# Patient Record
Sex: Male | Born: 1960 | Race: White | Hispanic: No | Marital: Married | Smoking: Current every day smoker
Health system: Southern US, Community
[De-identification: ages and names within clinical notes are randomized; demographics above are authoritative.]

## PROBLEM LIST (undated history)

## (undated) DIAGNOSIS — K759 Inflammatory liver disease, unspecified: Secondary | ICD-10-CM

## (undated) DIAGNOSIS — I10 Essential (primary) hypertension: Secondary | ICD-10-CM

---

## 2015-07-24 DIAGNOSIS — K4090 Unilateral inguinal hernia, without obstruction or gangrene, not specified as recurrent: Secondary | ICD-10-CM | POA: Insufficient documentation

## 2016-02-19 DIAGNOSIS — R945 Abnormal results of liver function studies: Secondary | ICD-10-CM

## 2016-02-19 DIAGNOSIS — R030 Elevated blood-pressure reading, without diagnosis of hypertension: Secondary | ICD-10-CM | POA: Insufficient documentation

## 2016-02-19 DIAGNOSIS — R7989 Other specified abnormal findings of blood chemistry: Secondary | ICD-10-CM | POA: Insufficient documentation

## 2017-04-30 ENCOUNTER — Emergency Department: Payer: Managed Care, Other (non HMO)

## 2017-04-30 ENCOUNTER — Encounter: Payer: Self-pay | Admitting: Emergency Medicine

## 2017-04-30 ENCOUNTER — Inpatient Hospital Stay
Admission: EM | Admit: 2017-04-30 | Discharge: 2017-05-08 | DRG: 439 | Disposition: A | Discharge status: Home / Self Care | Payer: Managed Care, Other (non HMO) | Attending: Internal Medicine | Admitting: Internal Medicine

## 2017-04-30 DIAGNOSIS — R112 Nausea with vomiting, unspecified: Secondary | ICD-10-CM | POA: Diagnosis present

## 2017-04-30 DIAGNOSIS — K852 Alcohol induced acute pancreatitis without necrosis or infection: Secondary | ICD-10-CM | POA: Diagnosis present

## 2017-04-30 DIAGNOSIS — F17210 Nicotine dependence, cigarettes, uncomplicated: Secondary | ICD-10-CM | POA: Diagnosis present

## 2017-04-30 DIAGNOSIS — I10 Essential (primary) hypertension: Secondary | ICD-10-CM | POA: Diagnosis present

## 2017-04-30 DIAGNOSIS — K5900 Constipation, unspecified: Secondary | ICD-10-CM | POA: Diagnosis present

## 2017-04-30 DIAGNOSIS — K760 Fatty (change of) liver, not elsewhere classified: Secondary | ICD-10-CM | POA: Diagnosis present

## 2017-04-30 DIAGNOSIS — E876 Hypokalemia: Secondary | ICD-10-CM | POA: Diagnosis present

## 2017-04-30 DIAGNOSIS — K2920 Alcoholic gastritis without bleeding: Secondary | ICD-10-CM | POA: Diagnosis present

## 2017-04-30 DIAGNOSIS — K859 Acute pancreatitis without necrosis or infection, unspecified: Secondary | ICD-10-CM | POA: Diagnosis present

## 2017-04-30 DIAGNOSIS — N2889 Other specified disorders of kidney and ureter: Secondary | ICD-10-CM

## 2017-04-30 DIAGNOSIS — K7010 Alcoholic hepatitis without ascites: Secondary | ICD-10-CM | POA: Diagnosis present

## 2017-04-30 DIAGNOSIS — E871 Hypo-osmolality and hyponatremia: Secondary | ICD-10-CM | POA: Diagnosis present

## 2017-04-30 DIAGNOSIS — R1010 Upper abdominal pain, unspecified: Secondary | ICD-10-CM

## 2017-04-30 DIAGNOSIS — K2980 Duodenitis without bleeding: Secondary | ICD-10-CM | POA: Diagnosis present

## 2017-04-30 DIAGNOSIS — F1010 Alcohol abuse, uncomplicated: Secondary | ICD-10-CM | POA: Diagnosis present

## 2017-04-30 HISTORY — DX: Essential (primary) hypertension: I10

## 2017-04-30 HISTORY — DX: Inflammatory liver disease, unspecified: K75.9

## 2017-04-30 LAB — TROPONIN I: Troponin I: <0.03 ng/mL (ref <0.03)

## 2017-04-30 LAB — BASIC METABOLIC PANEL
Anion gap: 11 (ref 5–15)
BUN: 10 mg/dL (ref 6–20)
CALCIUM: 8 mg/dL — AB (ref 8.9–10.3)
CO2: 19 mmol/L — ABNORMAL LOW (ref 22–32)
CREATININE: 0.76 mg/dL (ref 0.61–1.24)
Chloride: 95 mmol/L — ABNORMAL LOW (ref 101–111)
Glucose, Bld: 98 mg/dL (ref 65–99)
Potassium: 3.4 mmol/L — ABNORMAL LOW (ref 3.5–5.1)
SODIUM: 125 mmol/L — AB (ref 135–145)

## 2017-04-30 LAB — HEPATIC FUNCTION PANEL
ALK PHOS: 101 U/L (ref 38–126)
ALT: 130 U/L — ABNORMAL HIGH (ref 17–63)
AST: 245 U/L — ABNORMAL HIGH (ref 15–41)
Albumin: 3.7 g/dL (ref 3.5–5.0)
Bilirubin, Direct: <0.1 mg/dL — ABNORMAL LOW (ref 0.1–0.5)
TOTAL PROTEIN: 5.7 g/dL — AB (ref 6.5–8.1)
Total Bilirubin: 0.9 mg/dL (ref 0.3–1.2)

## 2017-04-30 LAB — CBC
HEMATOCRIT: 49.5 % (ref 40.0–52.0)
Hemoglobin: 18.8 g/dL — ABNORMAL HIGH (ref 13.0–18.0)
MCV: 97.6 fL (ref 80.0–100.0)
Platelets: 172 10*3/uL (ref 150–440)
RBC: 5.07 MIL/uL (ref 4.40–5.90)
RDW: 12.8 % (ref 11.5–14.5)
WBC: 15.2 10*3/uL — AB (ref 3.8–10.6)

## 2017-04-30 LAB — PROTIME-INR
INR: 0.96
Prothrombin Time: 12.8 seconds (ref 11.4–15.2)

## 2017-04-30 LAB — LIPASE, BLOOD: LIPASE: 254 U/L — AB (ref 11–51)

## 2017-04-30 MED ORDER — KETOROLAC TROMETHAMINE 30 MG/ML IJ SOLN
15.0000 mg | INTRAMUSCULAR | Status: AC
Start: 2017-04-30 — End: 2017-04-30
  Administered 2017-04-30: 15 mg via INTRAVENOUS
  Filled 2017-04-30: qty 1

## 2017-04-30 MED ORDER — POTASSIUM CHLORIDE CRYS ER 20 MEQ PO TBCR
20.0000 meq | EXTENDED_RELEASE_TABLET | Freq: Once | ORAL | Status: AC
  Administered 2017-04-30: 20 meq via ORAL
  Filled 2017-04-30: qty 1

## 2017-04-30 MED ORDER — THIAMINE HCL 100 MG/ML IJ SOLN: 100.0000 mg | Freq: Every day | INTRAMUSCULAR | Status: DC

## 2017-04-30 MED ORDER — MORPHINE SULFATE (PF) 2 MG/ML IV SOLN
1.0000 mg | INTRAVENOUS | Status: DC | PRN
  Administered 2017-05-01 – 2017-05-06 (×10): 1 mg via INTRAVENOUS
  Filled 2017-04-30 (×10): qty 1

## 2017-04-30 MED ORDER — PANTOPRAZOLE SODIUM 40 MG IV SOLR
40.0000 mg | Freq: Once | INTRAVENOUS | Status: AC
  Administered 2017-04-30: 40 mg via INTRAVENOUS
  Filled 2017-04-30: qty 40

## 2017-04-30 MED ORDER — ADULT MULTIVITAMIN W/MINERALS CH
1.0000 | ORAL_TABLET | Freq: Every day | ORAL | Status: DC
  Administered 2017-05-03 – 2017-05-08 (×6): 1 via ORAL
  Filled 2017-04-30 (×6): qty 1

## 2017-04-30 MED ORDER — SODIUM CHLORIDE 0.9 % IV SOLN
INTRAVENOUS | Status: DC
  Administered 2017-04-30 – 2017-05-03 (×5): via INTRAVENOUS

## 2017-04-30 MED ORDER — LORAZEPAM 1 MG PO TABS: 1.0000 mg | ORAL_TABLET | Freq: Four times a day (QID) | ORAL | Status: AC | PRN

## 2017-04-30 MED ORDER — MORPHINE SULFATE (PF) 4 MG/ML IV SOLN
4.0000 mg | Freq: Once | INTRAVENOUS | Status: AC
  Administered 2017-04-30: 4 mg via INTRAVENOUS
  Filled 2017-04-30: qty 1

## 2017-04-30 MED ORDER — ONDANSETRON HCL 4 MG PO TABS: 4.0000 mg | ORAL_TABLET | Freq: Four times a day (QID) | ORAL | Status: DC | PRN

## 2017-04-30 MED ORDER — SODIUM CHLORIDE 0.9 % IV BOLUS (SEPSIS)
1000.0000 mL | Freq: Once | INTRAVENOUS | Status: AC
  Administered 2017-04-30: 1000 mL via INTRAVENOUS

## 2017-04-30 MED ORDER — ONDANSETRON HCL 4 MG/2ML IJ SOLN
4.0000 mg | Freq: Four times a day (QID) | INTRAMUSCULAR | Status: DC | PRN
  Administered 2017-05-03: 4 mg via INTRAVENOUS
  Filled 2017-04-30: qty 2

## 2017-04-30 MED ORDER — PANTOPRAZOLE SODIUM 40 MG IV SOLR
40.0000 mg | Freq: Two times a day (BID) | INTRAVENOUS | Status: DC
Start: 2017-04-30 — End: 2017-05-07
  Administered 2017-04-30 – 2017-05-07 (×14): 40 mg via INTRAVENOUS
  Filled 2017-04-30 (×14): qty 40

## 2017-04-30 MED ORDER — VITAMIN B-1 100 MG PO TABS
100.0000 mg | ORAL_TABLET | Freq: Every day | ORAL | Status: DC
  Administered 2017-05-03 – 2017-05-08 (×6): 100 mg via ORAL
  Filled 2017-04-30 (×6): qty 1

## 2017-04-30 MED ORDER — IOPAMIDOL (ISOVUE-300) INJECTION 61%
100.0000 mL | Freq: Once | INTRAVENOUS | Status: AC | PRN
  Administered 2017-04-30: 100 mL via INTRAVENOUS

## 2017-04-30 MED ORDER — ONDANSETRON HCL 4 MG/2ML IJ SOLN
4.0000 mg | Freq: Once | INTRAMUSCULAR | Status: AC
  Administered 2017-04-30: 4 mg via INTRAVENOUS
  Filled 2017-04-30: qty 2

## 2017-04-30 MED ORDER — MORPHINE BOLUS VIA INFUSION: 4.0000 mg | Freq: Once | INTRAVENOUS | Status: DC

## 2017-04-30 MED ORDER — FOLIC ACID 1 MG PO TABS
1.0000 mg | ORAL_TABLET | Freq: Every day | ORAL | Status: DC
  Administered 2017-05-03 – 2017-05-08 (×6): 1 mg via ORAL
  Filled 2017-04-30 (×6): qty 1

## 2017-04-30 MED ORDER — NICOTINE 21 MG/24HR TD PT24
21.0000 mg | MEDICATED_PATCH | Freq: Every day | TRANSDERMAL | Status: DC
  Administered 2017-05-01 – 2017-05-08 (×8): 21 mg via TRANSDERMAL
  Filled 2017-04-30 (×8): qty 1

## 2017-04-30 MED ORDER — LORAZEPAM 2 MG/ML IJ SOLN: 1.0000 mg | Freq: Four times a day (QID) | INTRAMUSCULAR | Status: AC | PRN

## 2017-04-30 NOTE — ED Notes (Signed)
Floor refused to take report for pt stating that BP was to high (per their charge) - ED Charge Lea RN notified

## 2017-04-30 NOTE — ED Notes (Signed)
Attempted IV x2 - once in right hand and once in left [facility] without success

## 2017-04-30 NOTE — ED Notes (Signed)
Patient transported to CT 

## 2017-04-30 NOTE — ED Provider Notes (Addendum)
[facility] Emergency Department Provider Note  ____________________________________________  Time seen: Approximately 6:35 PM  I have reviewed the triage vital signs and the nursing notes.   HISTORY  Chief Complaint Chest Pain and Abdominal Pain    HPI Nathan Herrera is a 56 y.o. male who complains of diffuse upper abdominal pain with nausea and vomiting since yesterday. Nonradiating, no aggravating or alleviating factors. Severe, sharp, constant.  Reports that he drinks 4 beers a day, denies withdrawal symptoms when he doesn't drink. Never had anything like this before. Normally only eats about 1 meal a day at dinner.  Wife reports actually the patient usually drinks 12-24 beers a day.      History reviewed. No pertinent past medical history.   There are no active problems to display for this patient.    No past surgical history on file. Negative  Prior to Admission medications   Not on File  None   Allergies Patient has no known allergies.   No family history on file.  Social History Social History  Substance Use Topics  . Smoking status: Current Every Day Smoker    Packs/day: 0.50  . Smokeless tobacco: Never Used  . Alcohol use No     Comment: 3-4 beers daily    Review of Systems  Constitutional:   No fever or chills.  ENT:   No sore throat. No rhinorrhea. Cardiovascular:   No chest pain or syncope. Respiratory:   No dyspnea or cough. Gastrointestinal:   Positive as above for abdominal pain with vomiting. No constipation or diarrhea.  Musculoskeletal:   Negative for focal pain or swelling All other systems reviewed and are negative except as documented above in ROS and HPI.  ____________________________________________   PHYSICAL EXAM:  VITAL SIGNS: ED Triage Vitals  Enc Vitals Group     BP 04/30/17 1316 (!) 156/98     Pulse Rate 04/30/17 1316 73     Resp 04/30/17 1316 16     Temp 04/30/17 1316 97.6 F (36.4 C)      Temp src --      SpO2 04/30/17 1316 97 %     Weight 04/30/17 1317 170 lb (77.1 kg)     Height 04/30/17 1317 5\' 7"  (1.702 m)     Head Circumference --      Peak Flow --      Pain Score 04/30/17 1316 9     Pain Loc --      Pain Edu? --      Excl. in GC? --     Vital signs reviewed, nursing assessments reviewed.   Constitutional:   Alert and oriented. Well appearing and in no distress. Eyes:   No scleral icterus.  EOMI. No nystagmus. No conjunctival pallor. PERRL. ENT   Head:   Normocephalic and atraumatic.   Nose:   No congestion/rhinnorhea.    Mouth/Throat:   Dry mucous membranes, no pharyngeal erythema. No peritonsillar mass.    Neck:   No meningismus. Full ROM Hematological/Lymphatic/Immunilogical:   No cervical lymphadenopathy. Cardiovascular:   RRR. Symmetric bilateral radial and DP pulses.  No murmurs.  Respiratory:   Normal respiratory effort without tachypnea/retractions. Breath sounds are clear and equal bilaterally. No wheezes/rales/rhonchi. Gastrointestinal:   Soft with diffuse upper abdominal tenderness.. Non distended. There is no CVA tenderness.  No rebound, rigidity, or guarding. Genitourinary:   deferred Musculoskeletal:   Normal range of motion in all extremities. No joint effusions.  No lower extremity tenderness.  No  edema. Neurologic:   Normal speech and language.  Motor grossly intact. No gross focal neurologic deficits are appreciated.  Skin:    Skin is warm, dry and intact. No rash noted.  No petechiae, purpura, or bullae.  ____________________________________________    LABS (pertinent positives/negatives) (all labs ordered are listed, but only abnormal results are displayed) Labs Reviewed  BASIC METABOLIC PANEL - Abnormal; Notable for the following:       Result Value   Sodium 125 (*)    Potassium 3.4 (*)    Chloride 95 (*)    CO2 19 (*)    Calcium 8.0 (*)    All other components within normal limits  CBC - Abnormal; Notable for the  following:    WBC 15.2 (*)    Hemoglobin 18.8 (*)    All other components within normal limits  HEPATIC FUNCTION PANEL - Abnormal; Notable for the following:    Total Protein 5.7 (*)    AST 245 (*)    ALT 130 (*)    Bilirubin, Direct <0.1 (*)    All other components within normal limits  LIPASE, BLOOD - Abnormal; Notable for the following:    Lipase 254 (*)    All other components within normal limits  TROPONIN I   ____________________________________________   EKG  Interpreted by me Sinus rhythm rate of 79, normal axis and intervals. Normal QRS ST segments and T waves.  ____________________________________________    RADIOLOGY  Dg Chest 2 View  Result Date: 04/30/2017 CLINICAL DATA:  Chest pain and chronic cough EXAM: CHEST  2 VIEW COMPARISON:  None. FINDINGS: Lungs are clear. Heart size and pulmonary vascularity are normal. No adenopathy. There is a focal hiatal hernia. No pneumothorax. No bone lesions. IMPRESSION: Hiatal hernia present.  No edema or consolidation. Electronically Signed   By: Bretta Bang III M.D.   On: 04/30/2017 14:05   Ct Abdomen Pelvis W Contrast  Result Date: 04/30/2017 CLINICAL DATA:  Abdominal pain with nausea and vomiting EXAM: CT ABDOMEN AND PELVIS WITH CONTRAST TECHNIQUE: Multidetector CT imaging of the abdomen and pelvis was performed using the standard protocol following bolus administration of intravenous contrast. CONTRAST:  ISOVUE-300 IOPAMIDOL (ISOVUE-300) INJECTION 61% COMPARISON:  None. FINDINGS: Lower chest: There is slight bibasilar lung atelectatic change. No lung base edema or consolidation. There is a moderate hiatal hernia, incompletely visualized. Hepatobiliary: Liver measures 20.0 cm in length. There is diffuse hepatic steatosis. No focal liver lesions are evident. Gallbladder wall is not appreciably thickened. There is no biliary duct dilatation. Pancreas: The pancreatic head and uncinate process of mildly edematous. There is  fluid surrounding the head and uncinate process of the pancreas as well as extending posterior to the body of the pancreas consistent with pancreatitis. No pancreatic mass or duct dilatation is evident. The pancreatic tail appears normal. Fluid tracks from the pancreas to surround the celiac axis. Fluid extends to the level of the inferior vena cava anteriorly at the level of the insertion site of the renal veins. Fluid tracks along the third portion of the duodenum with irregular wall thickening along the third and distal second portions of the duodenum with apparent inflammation in this area. Fluid tracks more inferiorly in the midline and slightly to the right of midline to the level of the aortic bifurcation. Fluid abuts the anterior right perinephric fascia with slight anterior perinephric stranding on the right. Fluid does not extend into the pelvis from the pancreas. More anteriorly, fluid extends to abut the  first and proximal second portions of the duodenum. Peripancreatic fluid does not abut the stomach. Spleen: There is a 1.2 x 1.0 cm mass in the anterior spleen, statistically most likely a hemangioma. No other splenic lesions are evident. Adrenals/Urinary Tract: Adrenals appear normal bilaterally. There is a mass arising from the posterior mid left kidney measuring 0.9 x 0.9 cm which has attenuation values higher than is expected with a cyst. There is a simple cyst in the anterior lower pole right kidney measuring 1.6 x 1.5 cm. There is a nearby 8 x 8 mm cyst in the lower pole right kidney anteriorly. There is a 7 x 7 mm cyst in the posterior upper pole right kidney as well. There is no hydronephrosis on either side. There is no renal or ureteral calculus on either side. Urinary bladder is midline with wall thickness within normal limits. Stomach/Bowel: Irregular wall thickening is noted in the third portion of the duodenum with the surrounding peripancreatic fluid as described above. Elsewhere, there is  no appreciable bowel wall thickening. No bowel obstruction evident. No free air or portal venous air. Vascular/Lymphatic: There is no abdominal aortic aneurysm. There is slight atherosclerotic calcification in the aorta. Major mesenteric vessels appear patent. There is no adenopathy evident in the abdomen or pelvis. Reproductive: There are prostatic calculi in the leftward aspect of the prostate. Prostate and seminal vesicles appear normal in size and contour. No pelvic mass evident. There are several small calcifications in the right spermatic cord in the inguinal region, benign in appearance. Other: Appendix appears normal. There is no free fluid in the abdomen or pelvis. No abscess evident in the abdomen or pelvis. Musculoskeletal: There is degenerative change in the lumbar spine. There is an apparent hemangioma in the rightward aspect of the T10 vertebral body. There is no intramuscular or abdominal wall lesion evident. There are no blastic or lytic bone lesions. IMPRESSION: 1. Acute pancreatitis with edema of the head and uncinate process of the pancreas. There is peripancreatic fluid surrounding the head, uncinate process, and portion of the body of the pancreas. No pancreatic mass or pseudocyst evident. No pancreatic duct dilatation. 2. Peripancreatic fluid extends along the duodenum with extensive wall irregularity in the third portion of the duodenum. There may well be a degree of ulceration in this area. To a lesser degree, there is wall thickening in the second portion of the duodenum. Note that the degree of third portion duodenum inflammation may warrant nonemergent direct visualization. 3. There is a 0.9 x 0.9 cm mass arising from the posterior mid left kidney which has attenuation values higher than is expected with a simple cyst. As a small renal neoplasm could present in this manner, further assessment is advised. Nonemergent further evaluation with pre and post contrast MRI should be considered.  Pre and post contrast CT could alternatively be performed, but would likely be of decreased accuracy given lesion size. 4.  Aortic atherosclerosis. 5. No bowel obstruction. No frank abscess in the abdomen or pelvis. Appendix appears normal. 6. There are prostatic calculi. No renal or ureteral calculus on either side. No hydronephrosis. 7.  Moderate hiatal hernia, incompletely visualized. 8.  Enlarged liver with hepatic steatosis. Electronically Signed   By: Bretta Bang III M.D.   On: 04/30/2017 17:12    ____________________________________________   PROCEDURES Procedures  ____________________________________________   INITIAL IMPRESSION / ASSESSMENT AND PLAN / ED COURSE  Pertinent labs & imaging results that were available during my care of the patient were reviewed  by me and considered in my medical decision making (see chart for details).  Patient presents with abdominal pain nausea and vomiting. Labs and CT reveal acute pancreatitis, likely due to his history of drinking and poor oral intake otherwise. Continue IV fluids, IV analgesics and antiemetics. Case discussed with hospitalist for further management. No evidence of alcohol withdrawal at present time. Hemodynamically stable.      ____________________________________________   FINAL CLINICAL IMPRESSION(S) / ED DIAGNOSES  Final diagnoses:  Alcohol-induced acute pancreatitis, unspecified complication status  Pain of upper abdomen  Hyponatremia      New Prescriptions   No medications on file     Portions of this note were generated with dragon dictation software. Dictation errors may occur despite best attempts at proofreading.    Sharman Cheek, MD 04/30/17 Jaye Beagle    Sharman Cheek, MD 04/30/17 [phone]

## 2017-04-30 NOTE — ED Notes (Signed)
Pt reports that he is having chest pain and abd cramping that started yesterday afternoon - c/o N/V - denies diarrhea - denies shortness of breath at this time but reports that his left hand is numb at this time - denies any cardiac conditions or previous MI's

## 2017-04-30 NOTE — ED Triage Notes (Signed)
States chest pain, low abd pain, n/v since yesterday.

## 2017-04-30 NOTE — H&P (Signed)
Nathan Herrera is an 56 y.o. male.   Chief Complaint: abdominal pain HPI: this is a 56 year old male who has a history of drinking 6-12 beers a day presents with a 2 day history of epigastric pain associated with nausea and vomiting. He also takes BC powders for headaches 3 or 4 times a week. He denies any having any coffee-ground-looking emesis or dark stools. He was found to have pancreatitis and hospitalist services were consulted for admission.  History reviewed. No pertinent past medical history.  No past surgical history on file.history of left inguinal hernia repair  No family history on file. Social History:  reports that he has been smoking.  He has been smoking about 0.50 packs per day. He has never used smokeless tobacco. He reports that he does not drink alcohol or use drugs. drinks 6-12 beers per day  Allergies: No Known Allergies   (Not in a hospital admission)  Results for orders placed or performed during the hospital encounter of 04/30/17 (from the past 48 hour(s))  Basic metabolic panel     Status: Abnormal   Collection Time: 04/30/17  1:23 PM  Result Value Ref Range   Sodium 125 (L) 135 - 145 mmol/L   Potassium 3.4 (L) 3.5 - 5.1 mmol/L   Chloride 95 (L) 101 - 111 mmol/L   CO2 19 (L) 22 - 32 mmol/L   Glucose, Bld 98 65 - 99 mg/dL   BUN 10 6 - 20 mg/dL   Creatinine, Ser 0.76 0.61 - 1.24 mg/dL   Calcium 8.0 (L) 8.9 - 10.3 mg/dL   GFR calc non Af Amer >60 >60 mL/min   GFR calc Af Amer >60 >60 mL/min    Comment: (NOTE) The eGFR has been calculated using the CKD EPI equation. This calculation has not been validated in all clinical situations. eGFR's persistently <60 mL/min signify possible Chronic Kidney Disease.    Anion gap 11 5 - 15  CBC     Status: Abnormal   Collection Time: 04/30/17  1:23 PM  Result Value Ref Range   WBC 15.2 (H) 3.8 - 10.6 K/uL   RBC 5.07 4.40 - 5.90 MIL/uL   Hemoglobin 18.8 (H) 13.0 - 18.0 g/dL    Comment: CORRECTED FOR LIPEMIA RESULT  REPEATED AND VERIFIED    HCT 49.5 40.0 - 52.0 %   MCV 97.6 80.0 - 100.0 fL   MCH RESULTS UNAVAILABLE DUE TO INTERFERING SUBSTANCE 26.0 - 34.0 pg   MCHC RESULTS UNAVAILABLE DUE TO INTERFERING SUBSTANCE 32.0 - 36.0 g/dL   RDW 12.8 11.5 - 14.5 %   Platelets 172 150 - 440 K/uL  Troponin I     Status: None   Collection Time: 04/30/17  1:23 PM  Result Value Ref Range   Troponin I <0.03 <0.03 ng/mL  Hepatic function panel     Status: Abnormal   Collection Time: 04/30/17  1:23 PM  Result Value Ref Range   Total Protein 5.7 (L) 6.5 - 8.1 g/dL   Albumin 3.7 3.5 - 5.0 g/dL   AST 245 (H) 15 - 41 U/L    Comment: RESULT CONFIRMED BY MANUAL DILUTION SDR   ALT 130 (H) 17 - 63 U/L    Comment: RESULT CONFIRMED BY MANUAL DILUTION SDR   Alkaline Phosphatase 101 38 - 126 U/L   Total Bilirubin 0.9 0.3 - 1.2 mg/dL   Bilirubin, Direct <0.1 (L) 0.1 - 0.5 mg/dL   Indirect Bilirubin NOT CALCULATED 0.3 - 0.9 mg/dL  Lipase, blood  Status: Abnormal   Collection Time: 04/30/17  1:23 PM  Result Value Ref Range   Lipase 254 (H) 11 - 51 U/L   Dg Chest 2 View  Result Date: 04/30/2017 CLINICAL DATA:  Chest pain and chronic cough EXAM: CHEST  2 VIEW COMPARISON:  None. FINDINGS: Lungs are clear. Heart size and pulmonary vascularity are normal. No adenopathy. There is a focal hiatal hernia. No pneumothorax. No bone lesions. IMPRESSION: Hiatal hernia present.  No edema or consolidation. Electronically Signed   By: Lowella Grip III M.D.   On: 04/30/2017 14:05   Ct Abdomen Pelvis W Contrast  Result Date: 04/30/2017 CLINICAL DATA:  Abdominal pain with nausea and vomiting EXAM: CT ABDOMEN AND PELVIS WITH CONTRAST TECHNIQUE: Multidetector CT imaging of the abdomen and pelvis was performed using the standard protocol following bolus administration of intravenous contrast. CONTRAST:  161m ISOVUE-300 IOPAMIDOL (ISOVUE-300) INJECTION 61% COMPARISON:  None. FINDINGS: Lower chest: There is slight bibasilar lung  atelectatic change. No lung base edema or consolidation. There is a moderate hiatal hernia, incompletely visualized. Hepatobiliary: Liver measures 20.0 cm in length. There is diffuse hepatic steatosis. No focal liver lesions are evident. Gallbladder wall is not appreciably thickened. There is no biliary duct dilatation. Pancreas: The pancreatic head and uncinate process of mildly edematous. There is fluid surrounding the head and uncinate process of the pancreas as well as extending posterior to the body of the pancreas consistent with pancreatitis. No pancreatic mass or duct dilatation is evident. The pancreatic tail appears normal. Fluid tracks from the pancreas to surround the celiac axis. Fluid extends to the level of the inferior vena cava anteriorly at the level of the insertion site of the renal veins. Fluid tracks along the third portion of the duodenum with irregular wall thickening along the third and distal second portions of the duodenum with apparent inflammation in this area. Fluid tracks more inferiorly in the midline and slightly to the right of midline to the level of the aortic bifurcation. Fluid abuts the anterior right perinephric fascia with slight anterior perinephric stranding on the right. Fluid does not extend into the pelvis from the pancreas. More anteriorly, fluid extends to abut the first and proximal second portions of the duodenum. Peripancreatic fluid does not abut the stomach. Spleen: There is a 1.2 x 1.0 cm mass in the anterior spleen, statistically most likely a hemangioma. No other splenic lesions are evident. Adrenals/Urinary Tract: Adrenals appear normal bilaterally. There is a mass arising from the posterior mid left kidney measuring 0.9 x 0.9 cm which has attenuation values higher than is expected with a cyst. There is a simple cyst in the anterior lower pole right kidney measuring 1.6 x 1.5 cm. There is a nearby 8 x 8 mm cyst in the lower pole right kidney anteriorly. There  is a 7 x 7 mm cyst in the posterior upper pole right kidney as well. There is no hydronephrosis on either side. There is no renal or ureteral calculus on either side. Urinary bladder is midline with wall thickness within normal limits. Stomach/Bowel: Irregular wall thickening is noted in the third portion of the duodenum with the surrounding peripancreatic fluid as described above. Elsewhere, there is no appreciable bowel wall thickening. No bowel obstruction evident. No free air or portal venous air. Vascular/Lymphatic: There is no abdominal aortic aneurysm. There is slight atherosclerotic calcification in the aorta. Major mesenteric vessels appear patent. There is no adenopathy evident in the abdomen or pelvis. Reproductive: There are prostatic calculi  in the leftward aspect of the prostate. Prostate and seminal vesicles appear normal in size and contour. No pelvic mass evident. There are several small calcifications in the right spermatic cord in the inguinal region, benign in appearance. Other: Appendix appears normal. There is no free fluid in the abdomen or pelvis. No abscess evident in the abdomen or pelvis. Musculoskeletal: There is degenerative change in the lumbar spine. There is an apparent hemangioma in the rightward aspect of the T10 vertebral body. There is no intramuscular or abdominal wall lesion evident. There are no blastic or lytic bone lesions. IMPRESSION: 1. Acute pancreatitis with edema of the head and uncinate process of the pancreas. There is peripancreatic fluid surrounding the head, uncinate process, and portion of the body of the pancreas. No pancreatic mass or pseudocyst evident. No pancreatic duct dilatation. 2. Peripancreatic fluid extends along the duodenum with extensive wall irregularity in the third portion of the duodenum. There may well be a degree of ulceration in this area. To a lesser degree, there is wall thickening in the second portion of the duodenum. Note that the degree  of third portion duodenum inflammation may warrant nonemergent direct visualization. 3. There is a 0.9 x 0.9 cm mass arising from the posterior mid left kidney which has attenuation values higher than is expected with a simple cyst. As a small renal neoplasm could present in this manner, further assessment is advised. Nonemergent further evaluation with pre and post contrast MRI should be considered. Pre and post contrast CT could alternatively be performed, but would likely be of decreased accuracy given lesion size. 4.  Aortic atherosclerosis. 5. No bowel obstruction. No frank abscess in the abdomen or pelvis. Appendix appears normal. 6. There are prostatic calculi. No renal or ureteral calculus on either side. No hydronephrosis. 7.  Moderate hiatal hernia, incompletely visualized. 8.  Enlarged liver with hepatic steatosis. Electronically Signed   By: Lowella Grip III M.D.   On: 04/30/2017 17:12    Review of Systems  Constitutional: Negative for fever.  HENT: Negative for hearing loss.   Eyes: Negative for blurred vision.  Respiratory: Negative for shortness of breath.   Cardiovascular: Negative for chest pain.  Gastrointestinal: Positive for abdominal pain, nausea and vomiting.  Genitourinary: Negative for dysuria.  Musculoskeletal: Positive for joint pain.  Skin: Negative for rash.  Neurological: Negative for dizziness.    Blood pressure (!) 146/88, pulse 89, temperature 97.6 F (36.4 C), resp. rate (!) 23, height 5' 7"  (1.702 m), weight 77.1 kg (170 lb), SpO2 95 %. Physical Exam  Constitutional: He is oriented to person, place, and time. He appears well-developed and well-nourished. No distress.  HENT:  Head: Normocephalic and atraumatic.  Mouth/Throat: Oropharynx is clear and moist. No oropharyngeal exudate.  Eyes: Pupils are equal, round, and reactive to light. No scleral icterus.  Neck: Neck supple. No JVD present. No tracheal deviation present. No thyromegaly present.   Cardiovascular: Normal rate and regular rhythm.   No murmur heard. Respiratory: Effort normal and breath sounds normal. No respiratory distress. He exhibits no tenderness.  GI: Bowel sounds are normal.  Epigastric tenderness right rebound or guarding. Mild right CVA tenderness. Mild hepatomegaly.  Musculoskeletal: He exhibits no edema or tenderness.  Lymphadenopathy:    He has no cervical adenopathy.  Neurological: He is alert and oriented to person, place, and time.  Skin: Skin is warm and dry.     Assessment/Plan 1. Acute pancreatitis. Likely alcoholic pancreatitis. Patient does drink a large amount of  beers and has so for a long time. CT scan did not show any gallstones or pancreatic mass. Does have inflammation of the pancreas on CT. We'll make him nothing by mouth and give him IV fluids. We'll give antiemetics and pain medications as needed. 2. Alcoholic hepatitis. Does have elevated liver enzymes and the ratio that would be consistent with alcohol. We will hydrate him and recheck liver enzymes see if they're trending down. We'll also check an INR and ammonia level. He does have mild enlargement of the liver. He may be having early signs of cirrhosis. 3. Questionable duodenal ulcer. CT scan did show some thickening of the wad no wall which make an be consistent with an ulcer. He is not actively bleeding at this time. His history of BC powder use and alcohol use would certainly be consistent with this. We'll put on some IV Protonix. If he shows any signs of bleeding then will consult GI otherwise he could follow-up as an outpatient for further workup. 4. Hyponatremia. Likely dilutional from the amount of beer he drinks. Give him normal saline today and we'll recheck this in the morning. 5. Hypokalemia. Will replete. We'll also check a magnesium. 6. Alcohol abuse. He does not recall having any history of withdrawal. However we'll put him on the CIWA protocol just in case. He'll also be getting  thiamine and multivitamin. 7. Tobacco abuse. Given nicotine patch.  Total time spent 45 minutes  Baxter Hire, MD 04/30/2017, 7:25 PM

## 2017-04-30 NOTE — ED Notes (Signed)
Attempted to call report and the nurse stated she was not finished reviewing the chart yet - timer reset for 25 min

## 2017-05-01 LAB — CBC
HEMATOCRIT: 48.7 % (ref 40.0–52.0)
HEMATOCRIT: 46.1 % (ref 40.0–52.0)
Hemoglobin: 17.7 g/dL (ref 13.0–18.0)
Hemoglobin: 16.9 g/dL (ref 13.0–18.0)
MCH: 35 pg — AB (ref 26.0–34.0)
MCH: 35.3 pg — AB (ref 26.0–34.0)
MCHC: 36.3 g/dL — ABNORMAL HIGH (ref 32.0–36.0)
MCHC: 36.7 g/dL — AB (ref 32.0–36.0)
MCV: 96.5 fL (ref 80.0–100.0)
MCV: 96.3 fL (ref 80.0–100.0)
PLATELETS: 143 10*3/uL — AB (ref 150–440)
Platelets: 157 10*3/uL (ref 150–440)
RBC: 5.05 MIL/uL (ref 4.40–5.90)
RBC: 4.79 MIL/uL (ref 4.40–5.90)
RDW: 13.1 % (ref 11.5–14.5)
RDW: 13 % (ref 11.5–14.5)
WBC: 12.7 10*3/uL — AB (ref 3.8–10.6)
WBC: 11.5 10*3/uL — ABNORMAL HIGH (ref 3.8–10.6)

## 2017-05-01 MED ORDER — CLONIDINE HCL 0.1 MG PO TABS
0.1000 mg | ORAL_TABLET | Freq: Every day | ORAL | Status: DC
  Administered 2017-05-01 – 2017-05-04 (×4): 0.1 mg via ORAL
  Filled 2017-05-01 (×4): qty 1

## 2017-05-01 NOTE — Clinical Social Work Note (Signed)
Clinical Social Work Assessment  Patient Details  Name: Nathan Herrera MRN: [id number] Date of Birth: [birth year]  Date of referral:  05/01/17               Reason for consult:  Substance Use/ETOH Abuse                Permission sought to share information with:    Permission granted to share information::     Name::        Agency::     Relationship::     Contact Information:     Housing/Transportation Living arrangements for the past 2 months:  Single Family Home Source of Information:  Patient Patient Interpreter Needed:  None Criminal Activity/Legal Involvement Pertinent to Current Situation/Hospitalization:  No - Comment as needed Significant Relationships:  Adult Children, Significant Other Lives with:  Significant Other Do you feel safe going back to the place where you live?  Yes Need for family participation in patient care:  No (Coment)  Care giving concerns:  Patient lives in [place] with his fiance Maudie Mercury.    Social Worker assessment / plan:  Holiday representative (CSW) received verbal ETOH consult from RN in progression rounds this morning. CSW met with patient alone at bedside to address consult. Patient was alert and oriented X4 and was laying in the bed. CSW introduced self and explained role of CSW department. Patient reported that he lives in [place] with his fiance Maudie Mercury and works full time for [employer] based in [place]. Per patient he travels around the country and the world for his work. Patient reported that he is going to stop drinking and stated that this is a self inflicted illness. Patient reported that he will stop drinking because he knows it is bad for his health. CSW provided emotional support and outpatient substance abuse resources. CSW explained the benefits of [facility] and [place]. Patient appeared interested in substance abuse resources and thanked CSW for visit. Patient reported no other needs or concerns at this time. CSW will continue to follow and assist  as needed.    Employment status:  [employer] information:  Managed Care PT Recommendations:  Not assessed at this time Information / Referral to community resources:  Outpatient Substance Abuse Treatment Options  Patient/Family's Response to care:  Patient accepted substance abuse resources and stated that he will follow up with outpatient treatment.   Patient/Family's Understanding of and Emotional Response to Diagnosis, Current Treatment, and Prognosis:  Patient was aware of his diagnosis and stated that he is motivated to stop drinking.   Emotional Assessment Appearance:  Appears stated age Attitude/Demeanor/Rapport:    Affect (typically observed):  Accepting, Adaptable, Pleasant Orientation:  Oriented to Self, Oriented to Place, Oriented to  Time, Oriented to Situation Alcohol / Substance use:  Alcohol Use Psych involvement (Current and /or in the community):  No (Comment)  Discharge Needs  Concerns to be addressed:  Discharge Planning Concerns Readmission within the last 30 days:  No Current discharge risk:  Substance Abuse Barriers to Discharge:  Continued Medical Work up   [employer], Veronia Beets, LCSW 05/01/2017, 1:55 PM

## 2017-05-01 NOTE — Progress Notes (Signed)
[facility] [place] at [facility]   PATIENT NAME: Nathan Herrera    MR#:  [id number]  DATE OF BIRTH:  November 23, 1960  SUBJECTIVE: Patient is admitted for acute pancreatitis. The patient still has lots of abdominal pain and nausea. History of heavy alcohol abuse and last drink was 3 days ago. Has been having abdominal pain for the past 3 days. No withdrawal symptoms at this time did not patient denies any shaking, hallucinations. Main complaint is abdominal pain. And asking for heating pad. Complains of midepigastric abdominal pain radiating to the back.   CHIEF COMPLAINT:   Chief Complaint  Patient presents with  . Chest Pain  . Abdominal Pain    REVIEW OF SYSTEMS:    Review of Systems  Constitutional: Negative for chills and fever.  HENT: Negative for hearing loss.   Eyes: Negative for blurred vision, double vision and photophobia.  Respiratory: Negative for cough, hemoptysis and shortness of breath.   Cardiovascular: Negative for palpitations, orthopnea and leg swelling.  Gastrointestinal: Positive for abdominal pain, nausea and vomiting. Negative for diarrhea.  Genitourinary: Negative for dysuria and urgency.  Musculoskeletal: Negative for myalgias and neck pain.  Skin: Negative for rash.  Neurological: Negative for dizziness, focal weakness, seizures, weakness and headaches.  Psychiatric/Behavioral: Negative for memory loss. The patient does not have insomnia.     Nutrition:  Tolerating Diet: Tolerating PT:      DRUG ALLERGIES:  No Known Allergies  VITALS:  Blood pressure (!) 165/101, pulse 90, temperature 100 F (37.8 C), temperature source Oral, resp. rate 18, height 5\' 7"  (1.702 m), weight 79.8 kg (176 lb), SpO2 95 %.  PHYSICAL EXAMINATION:   Physical Exam  GENERAL:  56 y.o.-year-old patient lying in the bed with no acute distress.  EYES: Pupils equal, round, reactive to light   No scleral icterus. Extraocular muscles intact.  HEENT: Head  atraumatic, normocephalic. Oropharynx and nasopharynx clear.  NECK:  Supple, no jugular venous distention. No thyroid enlargement, no tenderness.  LUNGS: Normal breath sounds bilaterally, no wheezing, rales,rhonchi or crepitation. No use of accessory muscles of respiration.  CARDIOVASCULAR: S1, S2 normal. No murmurs, rubs, or gallops.  ABDOMEN:  Patient's abdomen is distended slightly. In severe generalized tenderness all over. No rebound tenderness. EXTREMITIES: No pedal edema, cyanosis, or clubbing.  NEUROLOGIC: Cranial nerves II through XII are intact. Muscle strength 5/5 in all extremities. Sensation intact. Gait not checked.  PSYCHIATRIC: The patient is alert and oriented x 3.  SKIN: No obvious rash, lesion, or ulcer.    LABORATORY PANEL:   CBC  Recent Labs Lab 05/01/17 0802  WBC 11.5*  HGB 16.9  HCT 46.1  PLT 157   ------------------------------------------------------------------------------------------------------------------  Chemistries   Recent Labs Lab 04/30/17 1323  NA 125*  K 3.4*  CL 95*  CO2 19*  GLUCOSE 98  BUN 10  CREATININE 0.76  CALCIUM 8.0*  AST 245*  ALT 130*  ALKPHOS 101  BILITOT 0.9   ------------------------------------------------------------------------------------------------------------------  Cardiac Enzymes  Recent Labs Lab 04/30/17 1323  TROPONINI <0.03   ------------------------------------------------------------------------------------------------------------------  RADIOLOGY:  Dg Chest 2 View  Result Date: 04/30/2017 CLINICAL DATA:  Chest pain and chronic cough EXAM: CHEST  2 VIEW COMPARISON:  None. FINDINGS: Lungs are clear. Heart size and pulmonary vascularity are normal. No adenopathy. There is a focal hiatal hernia. No pneumothorax. No bone lesions. IMPRESSION: Hiatal hernia present.  No edema or consolidation. Electronically Signed   By: Bretta Bang III M.D.   On: 04/30/2017  14:05   Ct Abdomen Pelvis W  Contrast  Result Date: 04/30/2017 CLINICAL DATA:  Abdominal pain with nausea and vomiting EXAM: CT ABDOMEN AND PELVIS WITH CONTRAST TECHNIQUE: Multidetector CT imaging of the abdomen and pelvis was performed using the standard protocol following bolus administration of intravenous contrast. CONTRAST:  ISOVUE-300 IOPAMIDOL (ISOVUE-300) INJECTION 61% COMPARISON:  None. FINDINGS: Lower chest: There is slight bibasilar lung atelectatic change. No lung base edema or consolidation. There is a moderate hiatal hernia, incompletely visualized. Hepatobiliary: Liver measures 20.0 cm in length. There is diffuse hepatic steatosis. No focal liver lesions are evident. Gallbladder wall is not appreciably thickened. There is no biliary duct dilatation. Pancreas: The pancreatic head and uncinate process of mildly edematous. There is fluid surrounding the head and uncinate process of the pancreas as well as extending posterior to the body of the pancreas consistent with pancreatitis. No pancreatic mass or duct dilatation is evident. The pancreatic tail appears normal. Fluid tracks from the pancreas to surround the celiac axis. Fluid extends to the level of the inferior vena cava anteriorly at the level of the insertion site of the renal veins. Fluid tracks along the third portion of the duodenum with irregular wall thickening along the third and distal second portions of the duodenum with apparent inflammation in this area. Fluid tracks more inferiorly in the midline and slightly to the right of midline to the level of the aortic bifurcation. Fluid abuts the anterior right perinephric fascia with slight anterior perinephric stranding on the right. Fluid does not extend into the pelvis from the pancreas. More anteriorly, fluid extends to abut the first and proximal second portions of the duodenum. Peripancreatic fluid does not abut the stomach. Spleen: There is a 1.2 x 1.0 cm mass in the anterior spleen, statistically most  likely a hemangioma. No other splenic lesions are evident. Adrenals/Urinary Tract: Adrenals appear normal bilaterally. There is a mass arising from the posterior mid left kidney measuring 0.9 x 0.9 cm which has attenuation values higher than is expected with a cyst. There is a simple cyst in the anterior lower pole right kidney measuring 1.6 x 1.5 cm. There is a nearby 8 x 8 mm cyst in the lower pole right kidney anteriorly. There is a 7 x 7 mm cyst in the posterior upper pole right kidney as well. There is no hydronephrosis on either side. There is no renal or ureteral calculus on either side. Urinary bladder is midline with wall thickness within normal limits. Stomach/Bowel: Irregular wall thickening is noted in the third portion of the duodenum with the surrounding peripancreatic fluid as described above. Elsewhere, there is no appreciable bowel wall thickening. No bowel obstruction evident. No free air or portal venous air. Vascular/Lymphatic: There is no abdominal aortic aneurysm. There is slight atherosclerotic calcification in the aorta. Major mesenteric vessels appear patent. There is no adenopathy evident in the abdomen or pelvis. Reproductive: There are prostatic calculi in the leftward aspect of the prostate. Prostate and seminal vesicles appear normal in size and contour. No pelvic mass evident. There are several small calcifications in the right spermatic cord in the inguinal region, benign in appearance. Other: Appendix appears normal. There is no free fluid in the abdomen or pelvis. No abscess evident in the abdomen or pelvis. Musculoskeletal: There is degenerative change in the lumbar spine. There is an apparent hemangioma in the rightward aspect of the T10 vertebral body. There is no intramuscular or abdominal wall lesion evident. There are no blastic or  lytic bone lesions. IMPRESSION: 1. Acute pancreatitis with edema of the head and uncinate process of the pancreas. There is peripancreatic fluid  surrounding the head, uncinate process, and portion of the body of the pancreas. No pancreatic mass or pseudocyst evident. No pancreatic duct dilatation. 2. Peripancreatic fluid extends along the duodenum with extensive wall irregularity in the third portion of the duodenum. There may well be a degree of ulceration in this area. To a lesser degree, there is wall thickening in the second portion of the duodenum. Note that the degree of third portion duodenum inflammation may warrant nonemergent direct visualization. 3. There is a 0.9 x 0.9 cm mass arising from the posterior mid left kidney which has attenuation values higher than is expected with a simple cyst. As a small renal neoplasm could present in this manner, further assessment is advised. Nonemergent further evaluation with pre and post contrast MRI should be considered. Pre and post contrast CT could alternatively be performed, but would likely be of decreased accuracy given lesion size. 4.  Aortic atherosclerosis. 5. No bowel obstruction. No frank abscess in the abdomen or pelvis. Appendix appears normal. 6. There are prostatic calculi. No renal or ureteral calculus on either side. No hydronephrosis. 7.  Moderate hiatal hernia, incompletely visualized. 8.  Enlarged liver with hepatic steatosis. Electronically Signed   By: Bretta Bang III M.D.   On: 04/30/2017 17:12     ASSESSMENT AND PLAN:   Active Problems:   Pancreatitis    #1. Acute pancreatitis due to alcohol. Continue IV fluids, IV pain medicines, nausea medicines, consolidated treatment. Lab is unable to process Chem-7, lipase because of hemolysis, lipemia/.got 3 samples today, Continue nothing by mouth, IV fluids, watch closely. #2 . Alkaline gastritis causing chest pain, burning in the chest. Continue IV PPIs.  #3 EtOH abuse patient drinks heavily. Last drink was 3 days ago. Watch for withdrawal symptoms, continue CIWA protocol. Denies to quit drinking slowly.  #4 ./severe  hyponatremia, hypokalemia. Lab not able to process Chem-7, lipase this morning because of specimen showing severe lipemia and hemolysis. Continue IV fluids.  All the records are reviewed and case discussed with Care Management/Social Workerr. Management plans discussed with the patient, family and they are in agreement.  CODE STATUS: full  TOTAL TIME TAKING CARE OF THIS PATIENT: 35 minutes.   POSSIBLE D/C IN 1-2 DAYS, DEPENDING ON CLINICAL CONDITION.   Katha Hamming M.D on 05/01/2017 at 9:12 AM  Between 7am to 6pm - Pager - [phone]  After 6pm go to [email or web address] - password [email or web address]  [facility]  Office  [phone]  CC: Primary care physician; Patient, No Pcp Per

## 2017-05-01 NOTE — Progress Notes (Signed)
Patient is unable to be drawn due to extreme Lipemia and Hemolysis.

## 2017-05-02 LAB — COMPREHENSIVE METABOLIC PANEL
ALBUMIN: 2.4 g/dL — AB (ref 3.5–5.0)
ALK PHOS: 57 U/L (ref 38–126)
ALT: 54 U/L (ref 17–63)
AST: 82 U/L — AB (ref 15–41)
Anion gap: 6 (ref 5–15)
BILIRUBIN TOTAL: 3.2 mg/dL — AB (ref 0.3–1.2)
BUN: 8 mg/dL (ref 6–20)
CALCIUM: 7 mg/dL — AB (ref 8.9–10.3)
CO2: 23 mmol/L (ref 22–32)
CREATININE: 0.75 mg/dL (ref 0.61–1.24)
Chloride: 107 mmol/L (ref 101–111)
GFR calc Af Amer: >60 mL/min (ref >60)
GFR calc non Af Amer: >60 mL/min (ref >60)
GLUCOSE: 115 mg/dL — AB (ref 65–99)
Potassium: 3.2 mmol/L — ABNORMAL LOW (ref 3.5–5.1)
SODIUM: 136 mmol/L (ref 135–145)
Total Protein: 5.4 g/dL — ABNORMAL LOW (ref 6.5–8.1)

## 2017-05-02 LAB — LIPASE, BLOOD: Lipase: 58 U/L — ABNORMAL HIGH (ref 11–51)

## 2017-05-02 LAB — HIV ANTIBODY (ROUTINE TESTING W REFLEX): HIV Screen 4th Generation wRfx: NONREACTIVE

## 2017-05-02 MED ORDER — POTASSIUM CHLORIDE 10 MEQ/100ML IV SOLN
10.0000 meq | INTRAVENOUS | Status: AC
  Administered 2017-05-02 (×2): 10 meq via INTRAVENOUS
  Filled 2017-05-02 (×2): qty 100

## 2017-05-02 MED ORDER — OXYCODONE-ACETAMINOPHEN 5-325 MG PO TABS
1.0000 | ORAL_TABLET | Freq: Four times a day (QID) | ORAL | Status: DC | PRN
  Administered 2017-05-02 – 2017-05-04 (×6): 1 via ORAL
  Filled 2017-05-02 (×6): qty 1

## 2017-05-02 MED ORDER — OXYCODONE-ACETAMINOPHEN 5-325 MG PO TABS
2.0000 | ORAL_TABLET | Freq: Four times a day (QID) | ORAL | Status: DC | PRN
  Administered 2017-05-03 – 2017-05-05 (×5): 2 via ORAL
  Filled 2017-05-02 (×5): qty 2

## 2017-05-02 NOTE — Progress Notes (Signed)
[facility] - [place] at [facility]   PATIENT NAME: Nathan Herrera    MR#:  [id number]  DATE OF BIRTH:  [birth year]  SUBJECTIVE: Patient is admitted for acute pancreatitis. Says that his abdominal pain is better. No nausea. Wants to try only clear liquids today. Unable to do lipase are liver function testing because of severe lipemic sample . She has some cough, phlegm.   CHIEF COMPLAINT:   Chief Complaint  Patient presents with  . Chest Pain  . Abdominal Pain    REVIEW OF SYSTEMS:    Review of Systems  Constitutional: Negative for chills and fever.  HENT: Negative for hearing loss.   Eyes: Negative for blurred vision, double vision and photophobia.  Respiratory: Negative for cough, hemoptysis and shortness of breath.   Cardiovascular: Negative for palpitations, orthopnea and leg swelling.  Gastrointestinal: Positive for abdominal pain. Negative for diarrhea, nausea and vomiting.  Genitourinary: Negative for dysuria and urgency.  Musculoskeletal: Negative for myalgias and neck pain.  Skin: Negative for rash.  Neurological: Negative for dizziness, focal weakness, seizures, weakness and headaches.  Psychiatric/Behavioral: Negative for memory loss. The patient does not have insomnia.     Nutrition:  Tolerating Diet: Tolerating PT:      DRUG ALLERGIES:  No Known Allergies  VITALS:  Blood pressure (!) 155/84, pulse 89, temperature 99.7 F (37.6 C), temperature source Oral, resp. rate 19, height 5\' 7"  (1.702 m), weight 79.8 kg (176 lb), SpO2 96 %.  PHYSICAL EXAMINATION:   Physical Exam  GENERAL:  56 y.o.-year-old patient lying in the bed with no acute distress.  EYES: Pupils equal, round, reactive to light   No scleral icterus. Extraocular muscles intact.  HEENT: Head atraumatic, normocephalic. Oropharynx and nasopharynx clear.  NECK:  Supple, no jugular venous distention. No thyroid enlargement, no tenderness.  LUNGS: Normal breath sounds  bilaterally, no wheezing, rales,rhonchi or crepitation. No use of accessory muscles of respiration.  CARDIOVASCULAR: S1, S2 normal. No murmurs, rubs, or gallops.  ABDOMEN:  Less tender today. Tenderness mostly appreciated in left upper quadrant and also right upper quadrant. BS present. EXTREMITIES: No pedal edema, cyanosis, or clubbing.  NEUROLOGIC: Cranial nerves II through XII are intact. Muscle strength 5/5 in all extremities. Sensation intact. Gait not checked.  PSYCHIATRIC: The patient is alert and oriented x 3.  SKIN: No obvious rash, lesion, or ulcer.    LABORATORY PANEL:   CBC  Recent Labs Lab 05/01/17 0802  WBC 11.5*  HGB 16.9  HCT 46.1  PLT 157   ------------------------------------------------------------------------------------------------------------------  Chemistries   Recent Labs Lab 04/30/17 1323  NA 125*  K 3.4*  CL 95*  CO2 19*  GLUCOSE 98  BUN 10  CREATININE 0.76  CALCIUM 8.0*  AST 245*  ALT 130*  ALKPHOS 101  BILITOT 0.9   ------------------------------------------------------------------------------------------------------------------  Cardiac Enzymes  Recent Labs Lab 04/30/17 1323  TROPONINI <0.03   ------------------------------------------------------------------------------------------------------------------  RADIOLOGY:  Dg Chest 2 View  Result Date: 04/30/2017 CLINICAL DATA:  Chest pain and chronic cough EXAM: CHEST  2 VIEW COMPARISON:  None. FINDINGS: Lungs are clear. Heart size and pulmonary vascularity are normal. No adenopathy. There is a focal hiatal hernia. No pneumothorax. No bone lesions. IMPRESSION: Hiatal hernia present.  No edema or consolidation. Electronically Signed   By: Bretta Bang III M.D.   On: 04/30/2017 14:05   Ct Abdomen Pelvis W Contrast  Result Date: 04/30/2017 CLINICAL DATA:  Abdominal pain with nausea and vomiting EXAM: CT ABDOMEN AND PELVIS  WITH CONTRAST TECHNIQUE: Multidetector CT imaging of the  abdomen and pelvis was performed using the standard protocol following bolus administration of intravenous contrast. CONTRAST:  ISOVUE-300 IOPAMIDOL (ISOVUE-300) INJECTION 61% COMPARISON:  None. FINDINGS: Lower chest: There is slight bibasilar lung atelectatic change. No lung base edema or consolidation. There is a moderate hiatal hernia, incompletely visualized. Hepatobiliary: Liver measures 20.0 cm in length. There is diffuse hepatic steatosis. No focal liver lesions are evident. Gallbladder wall is not appreciably thickened. There is no biliary duct dilatation. Pancreas: The pancreatic head and uncinate process of mildly edematous. There is fluid surrounding the head and uncinate process of the pancreas as well as extending posterior to the body of the pancreas consistent with pancreatitis. No pancreatic mass or duct dilatation is evident. The pancreatic tail appears normal. Fluid tracks from the pancreas to surround the celiac axis. Fluid extends to the level of the inferior vena cava anteriorly at the level of the insertion site of the renal veins. Fluid tracks along the third portion of the duodenum with irregular wall thickening along the third and distal second portions of the duodenum with apparent inflammation in this area. Fluid tracks more inferiorly in the midline and slightly to the right of midline to the level of the aortic bifurcation. Fluid abuts the anterior right perinephric fascia with slight anterior perinephric stranding on the right. Fluid does not extend into the pelvis from the pancreas. More anteriorly, fluid extends to abut the first and proximal second portions of the duodenum. Peripancreatic fluid does not abut the stomach. Spleen: There is a 1.2 x 1.0 cm mass in the anterior spleen, statistically most likely a hemangioma. No other splenic lesions are evident. Adrenals/Urinary Tract: Adrenals appear normal bilaterally. There is a mass arising from the posterior mid left kidney  measuring 0.9 x 0.9 cm which has attenuation values higher than is expected with a cyst. There is a simple cyst in the anterior lower pole right kidney measuring 1.6 x 1.5 cm. There is a nearby 8 x 8 mm cyst in the lower pole right kidney anteriorly. There is a 7 x 7 mm cyst in the posterior upper pole right kidney as well. There is no hydronephrosis on either side. There is no renal or ureteral calculus on either side. Urinary bladder is midline with wall thickness within normal limits. Stomach/Bowel: Irregular wall thickening is noted in the third portion of the duodenum with the surrounding peripancreatic fluid as described above. Elsewhere, there is no appreciable bowel wall thickening. No bowel obstruction evident. No free air or portal venous air. Vascular/Lymphatic: There is no abdominal aortic aneurysm. There is slight atherosclerotic calcification in the aorta. Major mesenteric vessels appear patent. There is no adenopathy evident in the abdomen or pelvis. Reproductive: There are prostatic calculi in the leftward aspect of the prostate. Prostate and seminal vesicles appear normal in size and contour. No pelvic mass evident. There are several small calcifications in the right spermatic cord in the inguinal region, benign in appearance. Other: Appendix appears normal. There is no free fluid in the abdomen or pelvis. No abscess evident in the abdomen or pelvis. Musculoskeletal: There is degenerative change in the lumbar spine. There is an apparent hemangioma in the rightward aspect of the T10 vertebral body. There is no intramuscular or abdominal wall lesion evident. There are no blastic or lytic bone lesions. IMPRESSION: 1. Acute pancreatitis with edema of the head and uncinate process of the pancreas. There is peripancreatic fluid surrounding the head, uncinate  process, and portion of the body of the pancreas. No pancreatic mass or pseudocyst evident. No pancreatic duct dilatation. 2. Peripancreatic fluid  extends along the duodenum with extensive wall irregularity in the third portion of the duodenum. There may well be a degree of ulceration in this area. To a lesser degree, there is wall thickening in the second portion of the duodenum. Note that the degree of third portion duodenum inflammation may warrant nonemergent direct visualization. 3. There is a 0.9 x 0.9 cm mass arising from the posterior mid left kidney which has attenuation values higher than is expected with a simple cyst. As a small renal neoplasm could present in this manner, further assessment is advised. Nonemergent further evaluation with pre and post contrast MRI should be considered. Pre and post contrast CT could alternatively be performed, but would likely be of decreased accuracy given lesion size. 4.  Aortic atherosclerosis. 5. No bowel obstruction. No frank abscess in the abdomen or pelvis. Appendix appears normal. 6. There are prostatic calculi. No renal or ureteral calculus on either side. No hydronephrosis. 7.  Moderate hiatal hernia, incompletely visualized. 8.  Enlarged liver with hepatic steatosis. Electronically Signed   By: Bretta Bang III M.D.   On: 04/30/2017 17:12     ASSESSMENT AND PLAN:   Active Problems:   Pancreatitis    #1. Acute pancreatitis due to alcohol. Continue IV fluids, IV pain medicines, nausea medicines,  Check labs including  Lipids,continue conservative  treatment. Lab is unable to process Chem-7, lipase because of hemolysis, lipemia/. Interfering with lab results.got 3 samples  Yesterday., Continue nothing by mouth, IV fluids, watch closely.  #2 . Alcoholic gastritis//hepatitis; causing chest pain, burning in the chest. Continue IV PPIs.   #3 EtOH abuse patient drinks heavily. Last drink was 3 days ago. Watch for withdrawal symptoms, continue CIWA protocol. Advised to quit. No withdrawal symptoms .Denies agitation or hallucinations.  #4 ./severe hyponatremia, hypokalemia.;beer  potomania Recheck BMP  today #5 questionable renal mass  or possible cyst. Seen on the CT abdomen and pelvis. Patient can have MRI of kidneys as an outpatient.  All the records are reviewed and case discussed with Care Management/Social Workerr. Management plans discussed with the patient, family and they are in agreement.  CODE STATUS: full  TOTAL TIME TAKING CARE OF THIS PATIENT: 35 minutes.   POSSIBLE D/C IN 1-2 DAYS, DEPENDING ON CLINICAL CONDITION.   Katha Hamming M.D on 05/02/2017 at 9:07 AM  Between 7am to 6pm - Pager - [phone]  After 6pm go to [email or web address] - password [email or web address]  [facility]  Office  [phone]  CC: Primary care physician; Patient, No Pcp Per

## 2017-05-03 ENCOUNTER — Inpatient Hospital Stay: Payer: Managed Care, Other (non HMO)

## 2017-05-03 LAB — LIPID PANEL
Cholesterol: 182 mg/dL (ref 0–200)
HDL: 14 mg/dL — ABNORMAL LOW (ref >40)
LDL Cholesterol: 110 mg/dL — ABNORMAL HIGH (ref 0–99)
TRIGLYCERIDES: 290 mg/dL — AB (ref <150)
Total CHOL/HDL Ratio: 13 RATIO
VLDL: 58 mg/dL — AB (ref 0–40)

## 2017-05-03 LAB — URINALYSIS, COMPLETE (UACMP) WITH MICROSCOPIC
BACTERIA UA: NONE SEEN
BILIRUBIN URINE: NEGATIVE
Glucose, UA: NEGATIVE mg/dL
Hgb urine dipstick: NEGATIVE
Ketones, ur: 5 mg/dL — AB
Leukocytes, UA: NEGATIVE
NITRITE: NEGATIVE
PH: 7 (ref 5.0–8.0)
Protein, ur: NEGATIVE mg/dL
RBC / HPF: NONE SEEN RBC/hpf (ref 0–5)
SPECIFIC GRAVITY, URINE: 1.008 (ref 1.005–1.030)
Squamous Epithelial / LPF: NONE SEEN

## 2017-05-03 LAB — LIPASE, BLOOD: Lipase: 49 U/L (ref 11–51)

## 2017-05-03 MED ORDER — GADOBENATE DIMEGLUMINE 529 MG/ML IV SOLN
20.0000 mL | Freq: Once | INTRAVENOUS | Status: AC | PRN
  Administered 2017-05-03: 16 mL via INTRAVENOUS

## 2017-05-03 NOTE — Progress Notes (Signed)
Patient ID: Nathan Herrera, male   DOB: Dec 20, 1960, 56 y.o.   MRN: [id number]  [facility] PROGRESS NOTE  Nathan Herrera NHA:[id number] DOB: 18-Oct-1960 DOA: 04/30/2017 PCP: Patient, No Pcp Per  HPI/Subjective:   Objective: Vitals:   05/03/17 0920 05/03/17 1511  BP: (!) 161/87 (!) 157/87  Pulse: 77 82  Resp: 20 20  Temp: 97.7 F (36.5 C) 98.3 F (36.8 C)  SpO2: 96% 93%    Filed Weights   04/30/17 1317 04/30/17 2102  Weight: 77.1 kg (170 lb) 79.8 kg (176 lb)    ROS: Review of Systems  Constitutional: Negative for chills and fever.  Eyes: Negative for blurred vision.  Respiratory: Negative for cough and shortness of breath.   Cardiovascular: Negative for chest pain.  Gastrointestinal: Positive for abdominal pain. Negative for constipation, diarrhea, nausea and vomiting.  Genitourinary: Negative for dysuria.  Musculoskeletal: Negative for joint pain.  Neurological: Negative for dizziness and headaches.   Exam: Physical Exam  Constitutional: He is oriented to person, place, and time.  HENT:  Nose: No mucosal edema.  Mouth/Throat: No oropharyngeal exudate or posterior oropharyngeal edema.  Eyes: Pupils are equal, round, and reactive to light. Conjunctivae, EOM and lids are normal.  Neck: No JVD present. Carotid bruit is not present. No edema present. No thyroid mass and no thyromegaly present.  Cardiovascular: S1 normal and S2 normal.  Exam reveals no gallop.   No murmur heard. Pulses:      Dorsalis pedis pulses are 2+ on the right side, and 2+ on the left side.  Respiratory: No respiratory distress. He has no wheezes. He has no rhonchi. He has no rales.  GI: Soft. Bowel sounds are normal. There is tenderness.  Musculoskeletal:       Right ankle: He exhibits no swelling.       Left ankle: He exhibits no swelling.  Lymphadenopathy:    He has no cervical adenopathy.  Neurological: He is alert and oriented to person, place, and time. No cranial nerve deficit.  Skin: Skin is  warm. No rash noted. Nails show no clubbing.  Psychiatric: He has a normal mood and affect.      Data Reviewed: Basic Metabolic Panel:  Recent Labs Lab 04/30/17 1323 05/02/17 0928  NA 125* 136  K 3.4* 3.2*  CL 95* 107  CO2 19* 23  GLUCOSE 98 115*  BUN 10 8  CREATININE 0.76 0.75  CALCIUM 8.0* 7.0*   Liver Function Tests:  Recent Labs Lab 04/30/17 1323 05/02/17 0928  AST 245* 82*  ALT 130* 54  ALKPHOS 101 57  BILITOT 0.9 3.2*  PROT 5.7* 5.4*  ALBUMIN 3.7 2.4*    Recent Labs Lab 04/30/17 1323 05/02/17 0928 05/03/17 0815  LIPASE 254* 58* 49   CBC:  Recent Labs Lab 04/30/17 1323 05/01/17 0311 05/01/17 0802  WBC 15.2* 12.7* 11.5*  HGB 18.8* 17.7 16.9  HCT 49.5 48.7 46.1  MCV 97.6 96.5 96.3  PLT 172 143* 157   Cardiac Enzymes:  Recent Labs Lab 04/30/17 1323  TROPONINI <0.03    Studies: Mr 3d Recon At Scanner  Result Date: 05/03/2017 CLINICAL DATA:  Acute pancreatitis. EXAM: MRI ABDOMEN WITHOUT AND WITH CONTRAST (INCLUDING MRCP) TECHNIQUE: Multiplanar multisequence MR imaging of the abdomen was performed both before and after the administration of intravenous contrast. Heavily T2-weighted images of the biliary and pancreatic ducts were obtained, and three-dimensional MRCP images were rendered by post processing. CONTRAST:  10mL MULTIHANCE GADOBENATE DIMEGLUMINE 529 MG/ML IV SOLN COMPARISON:  CT scan 04/30/2017 FINDINGS: Lower chest: Small bilateral pleural effusions and bibasilar atelectasis. There is a moderate to large hiatal hernia noted. No pericardial effusion. Hepatobiliary: Small left hepatic lobe hemangioma is noted. No worrisome hepatic lesions or intrahepatic biliary dilatation. The gallbladder is normal. No gallstones are identified. Small amount of pericholecystic fluid likely related to the pancreatitis. Normal caliber and course of the common bile duct. No common bile duct stones. Pancreas: Severe changes of pancreatitis involving and head  region of the pancreas. The head is swollen and inflamed no significant peripancreatic inflammation/ inflammatory phlegmon with marked inflammation of the second and third portions of the duodenum. Small amount of free fluid is also noted in the upper abdomen and right lower quadrant. No findings for the pancreatic necrosis or abscess. Normal caliber and course of the main pancreatic duct. Spleen:  Normal size.  No focal lesions. Adrenals/Urinary Tract:  The adrenal glands are normal. Small right renal cysts are noted. There is also a small hemorrhagic cyst associated with the midpole region of left kidney posteriorly. No worrisome renal lesions. Stomach/Bowel: Inflammation of the second and third portions of the duodenum but no obstructive findings. The visualized small bowel and colon are otherwise normal. Vascular/Lymphatic: No pathologically enlarged lymph nodes identified. No abdominal aortic aneurysm demonstrated. Other:  No abdominal wall hernia or subcutaneous lesions. Musculoskeletal: No significant bony findings. Incidental T10 hemangiomas noted. IMPRESSION: 1. Acute pancreatitis with specific findings as discussed above. No evidence of pancreatic necrosis. Normal caliber and course of the common bile duct and pancreatic duct. No gallstones or common bile duct stones. 2. Associated marked inflammation of the second and third portions of the duodenum but no obstruction. 3. Small right renal cysts and small hemorrhagic cyst involving the left kidney. Electronically Signed   By: Rudie Meyer M.D.   On: 05/03/2017 13:07   Mr Abdomen Mrcp Vivien Rossetti Contast  Result Date: 05/03/2017 CLINICAL DATA:  Acute pancreatitis. EXAM: MRI ABDOMEN WITHOUT AND WITH CONTRAST (INCLUDING MRCP) TECHNIQUE: Multiplanar multisequence MR imaging of the abdomen was performed both before and after the administration of intravenous contrast. Heavily T2-weighted images of the biliary and pancreatic ducts were obtained, and  three-dimensional MRCP images were rendered by post processing. CONTRAST:  16mL MULTIHANCE GADOBENATE DIMEGLUMINE 529 MG/ML IV SOLN COMPARISON:  CT scan 04/30/2017 FINDINGS: Lower chest: Small bilateral pleural effusions and bibasilar atelectasis. There is a moderate to large hiatal hernia noted. No pericardial effusion. Hepatobiliary: Small left hepatic lobe hemangioma is noted. No worrisome hepatic lesions or intrahepatic biliary dilatation. The gallbladder is normal. No gallstones are identified. Small amount of pericholecystic fluid likely related to the pancreatitis. Normal caliber and course of the common bile duct. No common bile duct stones. Pancreas: Severe changes of pancreatitis involving and head region of the pancreas. The head is swollen and inflamed no significant peripancreatic inflammation/ inflammatory phlegmon with marked inflammation of the second and third portions of the duodenum. Small amount of free fluid is also noted in the upper abdomen and right lower quadrant. No findings for the pancreatic necrosis or abscess. Normal caliber and course of the main pancreatic duct. Spleen:  Normal size.  No focal lesions. Adrenals/Urinary Tract:  The adrenal glands are normal. Small right renal cysts are noted. There is also a small hemorrhagic cyst associated with the midpole region of left kidney posteriorly. No worrisome renal lesions. Stomach/Bowel: Inflammation of the second and third portions of the duodenum but no obstructive findings. The visualized small bowel and colon are  otherwise normal. Vascular/Lymphatic: No pathologically enlarged lymph nodes identified. No abdominal aortic aneurysm demonstrated. Other:  No abdominal wall hernia or subcutaneous lesions. Musculoskeletal: No significant bony findings. Incidental T10 hemangiomas noted. IMPRESSION: 1. Acute pancreatitis with specific findings as discussed above. No evidence of pancreatic necrosis. Normal caliber and course of the common bile  duct and pancreatic duct. No gallstones or common bile duct stones. 2. Associated marked inflammation of the second and third portions of the duodenum but no obstruction. 3. Small right renal cysts and small hemorrhagic cyst involving the left kidney. Electronically Signed   By: Rudie Meyer M.D.   On: 05/03/2017 13:07    Scheduled Meds: . cloNIDine  0.1 mg Oral Daily  . folic acid  1 mg Oral Daily  . multivitamin with minerals  1 tablet Oral Daily  . nicotine  21 mg Transdermal Daily  . pantoprazole (PROTONIX) IV  40 mg Intravenous Q12H  . thiamine  100 mg Oral Daily   Or  . thiamine  100 mg Intravenous Daily   Continuous Infusions: . sodium chloride 50 mL/hr at 05/03/17 0531    Assessment/Plan:  1. Acute Pancreatitis-IVF, pain control, mri showed inflammation pancreas But no pseudocyst or necrosis. Start liquid diet since lipase normalized today 2. Alcohol abuse. Patient must stop alcohol.  3. Inflammation of the duodenum on Protonix IV. Start liquid diet today and see if patient can tolerate. 4. Elevated liver function tests secondary to alcohol abuse 5. MRI of the kidneys showed a hemorrhagic cyst  Code Status:     Code Status Orders        Start     Ordered   04/30/17 2104  Full code  Continuous     04/30/17 2103    Code Status History    Date Active Date Inactive Code Status Order ID Comments User Context   This patient has a current code status but no historical code status.     Family Communication: Patient asked me to call him his fiance at [phone]. I discussed his case at length with her on the phone this afternoon Disposition Plan: Potentially home on Friday  Time spent: 35 minutes  Renae Gloss, [employer]

## 2017-05-04 LAB — HEPATIC FUNCTION PANEL
ALBUMIN: 2.6 g/dL — AB (ref 3.5–5.0)
ALT: 43 U/L (ref 17–63)
AST: 55 U/L — AB (ref 15–41)
Alkaline Phosphatase: 59 U/L (ref 38–126)
Bilirubin, Direct: 1 mg/dL — ABNORMAL HIGH (ref 0.1–0.5)
Indirect Bilirubin: 1.3 mg/dL — ABNORMAL HIGH (ref 0.3–0.9)
TOTAL PROTEIN: 6.3 g/dL — AB (ref 6.5–8.1)
Total Bilirubin: 2.3 mg/dL — ABNORMAL HIGH (ref 0.3–1.2)

## 2017-05-04 LAB — LIPASE, BLOOD: LIPASE: 46 U/L (ref 11–51)

## 2017-05-04 LAB — BASIC METABOLIC PANEL
ANION GAP: 8 (ref 5–15)
BUN: 8 mg/dL (ref 6–20)
CALCIUM: 8.4 mg/dL — AB (ref 8.9–10.3)
CO2: 30 mmol/L (ref 22–32)
Chloride: 96 mmol/L — ABNORMAL LOW (ref 101–111)
Creatinine, Ser: 0.66 mg/dL (ref 0.61–1.24)
Glucose, Bld: 90 mg/dL (ref 65–99)
Potassium: 2.9 mmol/L — ABNORMAL LOW (ref 3.5–5.1)
Sodium: 134 mmol/L — ABNORMAL LOW (ref 135–145)

## 2017-05-04 LAB — CBC
HCT: 40.6 % (ref 40.0–52.0)
Hemoglobin: 14.4 g/dL (ref 13.0–18.0)
MCH: 34.6 pg — ABNORMAL HIGH (ref 26.0–34.0)
MCHC: 35.5 g/dL (ref 32.0–36.0)
MCV: 97.4 fL (ref 80.0–100.0)
PLATELETS: 133 10*3/uL — AB (ref 150–440)
RBC: 4.17 MIL/uL — ABNORMAL LOW (ref 4.40–5.90)
RDW: 13 % (ref 11.5–14.5)
WBC: 9.5 10*3/uL (ref 3.8–10.6)

## 2017-05-04 LAB — MAGNESIUM: Magnesium: 1.7 mg/dL (ref 1.7–2.4)

## 2017-05-04 MED ORDER — POTASSIUM CHLORIDE CRYS ER 20 MEQ PO TBCR
40.0000 meq | EXTENDED_RELEASE_TABLET | Freq: Two times a day (BID) | ORAL | Status: AC
  Administered 2017-05-04 – 2017-05-05 (×3): 40 meq via ORAL
  Filled 2017-05-04 (×3): qty 2

## 2017-05-04 MED ORDER — AMLODIPINE BESYLATE 5 MG PO TABS
5.0000 mg | ORAL_TABLET | Freq: Every day | ORAL | Status: DC
  Administered 2017-05-04 – 2017-05-08 (×5): 5 mg via ORAL
  Filled 2017-05-04 (×5): qty 1

## 2017-05-04 MED ORDER — DOCUSATE SODIUM 100 MG PO CAPS
100.0000 mg | ORAL_CAPSULE | Freq: Two times a day (BID) | ORAL | Status: DC
  Administered 2017-05-04 – 2017-05-08 (×8): 100 mg via ORAL
  Filled 2017-05-04 (×9): qty 1

## 2017-05-04 MED ORDER — MAGNESIUM SULFATE 2 GM/50ML IV SOLN
2.0000 g | Freq: Once | INTRAVENOUS | Status: AC
  Administered 2017-05-04: 2 g via INTRAVENOUS
  Filled 2017-05-04: qty 50

## 2017-05-04 MED ORDER — POTASSIUM CHLORIDE 10 MEQ/100ML IV SOLN
10.0000 meq | INTRAVENOUS | Status: AC
  Administered 2017-05-04 (×3): 10 meq via INTRAVENOUS
  Filled 2017-05-04 (×3): qty 100

## 2017-05-04 NOTE — Progress Notes (Signed)
Patient's BP 182/96. Dr Renae Gloss made aware. New orders received for Norvasc 5 mg tab daily start now.   Stephannie Peters, RN

## 2017-05-04 NOTE — Evaluation (Signed)
Physical Therapy Evaluation Patient Details Name: Nathan Herrera MRN: [id number] DOB: [birth year] Today's Date: 05/04/2017   History of Present Illness  Pt is a 56 y.o. M who presented to the ED with chest and lower abdominal pain as well as nausea and vomiting. Pt admitted 04/30/2017 with alcohol induced pancreatitis.     Clinical Impression  Prior to admission pt reports independent with ADLs and functional mobility. Pt reports family/friends available to assist as needed upon discharge. Pt A and O x4 throughout entire session. Pt reports 2/10 lower abdominal and back pain beginning and end of session with reported 1/10 back pain with ambulation. CGA provided during session but not required (pt appears independent with functional mobility); pt demonstrates no unsteadiness or loss of balance; pt reports he is at his baseline and it "feels good to get up and moving." No acute PT needs identified. Will discharge pt from PT in-house.    Follow Up Recommendations No PT follow up    Equipment Recommendations  None recommended by PT    Recommendations for Other Services       Precautions / Restrictions Precautions Precautions: Other (comment) Precaution Comments: on telemetry Restrictions Weight Bearing Restrictions: No      Mobility  Bed Mobility Overal bed mobility: Modified Independent                Transfers Overall transfer level: Modified independent   Transfers: Sit to/from Stand Sit to Stand: Min guard         General transfer comment: CGA provided but not required; no loss of balance or difficulty performing noted  Ambulation/Gait Ambulation/Gait assistance: Min guard Ambulation Distance (Feet):  (50+50)         General Gait Details: CGA provided but not required; no loss of balance noted; pt demonstrates with decreased B LE step length and wide base of support but reports this as baseline  Stairs Stairs: Yes Stairs assistance: Min guard Stair Management:  One rail Right Number of Stairs: 12 General stair comments: CGA provided but not required  Wheelchair Mobility    Modified Rankin (Stroke Patients Only)       Balance Overall balance assessment: Modified Independent Sitting-balance support: No upper extremity supported;Feet supported Sitting balance-Leahy Scale: Good     Standing balance support: No upper extremity supported;During functional activity Standing balance-Leahy Scale: Good                               Pertinent Vitals/Pain Pain Assessment: 0-10 Pain Location: pt reports 2/10 lower abdominal and back pain beginning and end of session with reported 1/10 back pain with ambulation Pain Descriptors / Indicators: Aching;Sore;Discomfort Pain Intervention(s): Limited activity within patient's tolerance;Monitored during session;Repositioned;Premedicated before session HR - 97 to 106 bpm throughout session via telemetry    Home Living Family/patient expects to be discharged to:: Private residence Living Arrangements: Spouse/significant other Available Help at Discharge: Family;Friend(s) Type of Home: Apartment Home Access: Stairs to enter Entrance Stairs-Rails: Can reach both Entrance Stairs-Number of Steps: 24 Home Layout: One level Home Equipment: Cane - single point      Prior Function Level of Independence: Independent               Hand Dominance   Dominant Hand: Right    Extremity/Trunk Assessment   Upper Extremity Assessment Upper Extremity Assessment: Overall WFL for tasks assessed    Lower Extremity Assessment Lower Extremity Assessment: Overall WFL for tasks assessed  Communication   Communication: No difficulties  Cognition Arousal/Alertness: Awake/alert Behavior During Therapy: WFL for tasks assessed/performed Overall Cognitive Status: Within Functional Limits for tasks assessed                                        General Comments       Exercises     Assessment/Plan    PT Assessment Patent does not need any further PT services  PT Problem List         PT Treatment Interventions      PT Goals (Current goals can be found in the Care Plan section)  Acute Rehab PT Goals PT Goal Formulation: All assessment and education complete, DC therapy    Frequency     Barriers to discharge        Co-evaluation               AM-PAC PT "6 Clicks" Daily Activity  Outcome Measure Difficulty turning over in bed (including adjusting bedclothes, sheets and blankets)?: None Difficulty moving from lying on back to sitting on the side of the bed? : A Little Difficulty sitting down on and standing up from a chair with arms (e.g., wheelchair, bedside commode, etc,.)?: None Help needed moving to and from a bed to chair (including a wheelchair)?: None Help needed walking in hospital room?: None Help needed climbing 3-5 steps with a railing? : None 6 Click Score: 23    End of Session Equipment Utilized During Treatment: Gait belt Activity Tolerance: Patient tolerated treatment well Patient left: in chair;with call bell/phone within reach;with chair alarm set Nurse Communication: Mobility status      Time: 5397-6734 PT Time Calculation (min) (ACUTE ONLY): 25 min   Charges:         PT G CodesGwenlyn Found, SPT 2017/05/06,2:33 PM [id number]

## 2017-05-04 NOTE — Progress Notes (Signed)
Patient ID: Nathan Herrera, male   DOB: July 11, 1961, 56 y.o.   MRN: [id number]   [facility] PROGRESS NOTE  Jodi Kappes WGN:[id number] DOB: June 05, 1961 DOA: 04/30/2017 PCP: Patient, No Pcp Per  HPI/Subjective: Patient with some abdominal pain. Also having some upper back pain and low back pain from being in the bed. Tolerated liquid diet yesterday.  Objective: Vitals:   05/03/17 2006 05/04/17 0741  BP: (!) 165/97 (!) 160/92  Pulse: 86 84  Resp: 18 20  Temp: 98.3 F (36.8 C) 98.4 F (36.9 C)  SpO2: 90% 95%    Filed Weights   04/30/17 1317 04/30/17 2102  Weight: 77.1 kg (170 lb) 79.8 kg (176 lb)    ROS: Review of Systems  Constitutional: Negative for chills and fever.  Eyes: Negative for blurred vision.  Respiratory: Negative for cough and shortness of breath.   Cardiovascular: Negative for chest pain.  Gastrointestinal: Positive for abdominal pain. Negative for constipation, diarrhea, nausea and vomiting.  Genitourinary: Negative for dysuria.  Musculoskeletal: Positive for back pain and joint pain.  Neurological: Negative for dizziness and headaches.   Exam: Physical Exam  Constitutional: He is oriented to person, place, and time.  HENT:  Nose: No mucosal edema.  Mouth/Throat: No oropharyngeal exudate or posterior oropharyngeal edema.  Eyes: Pupils are equal, round, and reactive to light. Conjunctivae, EOM and lids are normal.  Neck: No JVD present. Carotid bruit is not present. No edema present. No thyroid mass and no thyromegaly present.  Cardiovascular: S1 normal and S2 normal.  Exam reveals no gallop.   No murmur heard. Pulses:      Dorsalis pedis pulses are 2+ on the right side, and 2+ on the left side.  Respiratory: No respiratory distress. He has no wheezes. He has no rhonchi. He has no rales.  GI: Soft. Bowel sounds are normal. There is tenderness.  Musculoskeletal:       Right ankle: He exhibits no swelling.       Left ankle: He exhibits no swelling.   Lymphadenopathy:    He has no cervical adenopathy.  Neurological: He is alert and oriented to person, place, and time. No cranial nerve deficit.  Skin: Skin is warm. No rash noted. Nails show no clubbing.  Psychiatric: He has a normal mood and affect.      Data Reviewed: Basic Metabolic Panel:  Recent Labs Lab 04/30/17 1323 05/02/17 0928 05/04/17 0437  NA 125* 136 134*  K 3.4* 3.2* 2.9*  CL 95* 107 96*  CO2 19* 23 30  GLUCOSE 98 115* 90  BUN 10 8 8   CREATININE 0.76 0.75 0.66  CALCIUM 8.0* 7.0* 8.4*  MG  --   --  1.7   Liver Function Tests:  Recent Labs Lab 04/30/17 1323 05/02/17 0928 05/04/17 0437  AST 245* 82* 55*  ALT 130* 54 43  ALKPHOS 101 57 59  BILITOT 0.9 3.2* 2.3*  PROT 5.7* 5.4* 6.3*  ALBUMIN 3.7 2.4* 2.6*    Recent Labs Lab 04/30/17 1323 05/02/17 0928 05/03/17 0815 05/04/17 0437  LIPASE 254* 58* 49 46   CBC:  Recent Labs Lab 04/30/17 1323 05/01/17 0311 05/01/17 0802 05/04/17 0437  WBC 15.2* 12.7* 11.5* 9.5  HGB 18.8* 17.7 16.9 14.4  HCT 49.5 48.7 46.1 40.6  MCV 97.6 96.5 96.3 97.4  PLT 172 143* 157 133*   Cardiac Enzymes:  Recent Labs Lab 04/30/17 1323  TROPONINI <0.03    Studies: Mr 3d Recon At Scanner  Result Date: 05/03/2017 CLINICAL  DATA:  Acute pancreatitis. EXAM: MRI ABDOMEN WITHOUT AND WITH CONTRAST (INCLUDING MRCP) TECHNIQUE: Multiplanar multisequence MR imaging of the abdomen was performed both before and after the administration of intravenous contrast. Heavily T2-weighted images of the biliary and pancreatic ducts were obtained, and three-dimensional MRCP images were rendered by post processing. CONTRAST:  19mL MULTIHANCE GADOBENATE DIMEGLUMINE 529 MG/ML IV SOLN COMPARISON:  CT scan 04/30/2017 FINDINGS: Lower chest: Small bilateral pleural effusions and bibasilar atelectasis. There is a moderate to large hiatal hernia noted. No pericardial effusion. Hepatobiliary: Small left hepatic lobe hemangioma is noted. No  worrisome hepatic lesions or intrahepatic biliary dilatation. The gallbladder is normal. No gallstones are identified. Small amount of pericholecystic fluid likely related to the pancreatitis. Normal caliber and course of the common bile duct. No common bile duct stones. Pancreas: Severe changes of pancreatitis involving and head region of the pancreas. The head is swollen and inflamed no significant peripancreatic inflammation/ inflammatory phlegmon with marked inflammation of the second and third portions of the duodenum. Small amount of free fluid is also noted in the upper abdomen and right lower quadrant. No findings for the pancreatic necrosis or abscess. Normal caliber and course of the main pancreatic duct. Spleen:  Normal size.  No focal lesions. Adrenals/Urinary Tract:  The adrenal glands are normal. Small right renal cysts are noted. There is also a small hemorrhagic cyst associated with the midpole region of left kidney posteriorly. No worrisome renal lesions. Stomach/Bowel: Inflammation of the second and third portions of the duodenum but no obstructive findings. The visualized small bowel and colon are otherwise normal. Vascular/Lymphatic: No pathologically enlarged lymph nodes identified. No abdominal aortic aneurysm demonstrated. Other:  No abdominal wall hernia or subcutaneous lesions. Musculoskeletal: No significant bony findings. Incidental T10 hemangiomas noted. IMPRESSION: 1. Acute pancreatitis with specific findings as discussed above. No evidence of pancreatic necrosis. Normal caliber and course of the common bile duct and pancreatic duct. No gallstones or common bile duct stones. 2. Associated marked inflammation of the second and third portions of the duodenum but no obstruction. 3. Small right renal cysts and small hemorrhagic cyst involving the left kidney. Electronically Signed   By: Rudie Meyer M.D.   On: 05/03/2017 13:07   Mr Abdomen Mrcp Vivien Rossetti Contast  Result Date:  05/03/2017 CLINICAL DATA:  Acute pancreatitis. EXAM: MRI ABDOMEN WITHOUT AND WITH CONTRAST (INCLUDING MRCP) TECHNIQUE: Multiplanar multisequence MR imaging of the abdomen was performed both before and after the administration of intravenous contrast. Heavily T2-weighted images of the biliary and pancreatic ducts were obtained, and three-dimensional MRCP images were rendered by post processing. CONTRAST:  30mL MULTIHANCE GADOBENATE DIMEGLUMINE 529 MG/ML IV SOLN COMPARISON:  CT scan 04/30/2017 FINDINGS: Lower chest: Small bilateral pleural effusions and bibasilar atelectasis. There is a moderate to large hiatal hernia noted. No pericardial effusion. Hepatobiliary: Small left hepatic lobe hemangioma is noted. No worrisome hepatic lesions or intrahepatic biliary dilatation. The gallbladder is normal. No gallstones are identified. Small amount of pericholecystic fluid likely related to the pancreatitis. Normal caliber and course of the common bile duct. No common bile duct stones. Pancreas: Severe changes of pancreatitis involving and head region of the pancreas. The head is swollen and inflamed no significant peripancreatic inflammation/ inflammatory phlegmon with marked inflammation of the second and third portions of the duodenum. Small amount of free fluid is also noted in the upper abdomen and right lower quadrant. No findings for the pancreatic necrosis or abscess. Normal caliber and course of the main pancreatic duct.  Spleen:  Normal size.  No focal lesions. Adrenals/Urinary Tract:  The adrenal glands are normal. Small right renal cysts are noted. There is also a small hemorrhagic cyst associated with the midpole region of left kidney posteriorly. No worrisome renal lesions. Stomach/Bowel: Inflammation of the second and third portions of the duodenum but no obstructive findings. The visualized small bowel and colon are otherwise normal. Vascular/Lymphatic: No pathologically enlarged lymph nodes identified. No  abdominal aortic aneurysm demonstrated. Other:  No abdominal wall hernia or subcutaneous lesions. Musculoskeletal: No significant bony findings. Incidental T10 hemangiomas noted. IMPRESSION: 1. Acute pancreatitis with specific findings as discussed above. No evidence of pancreatic necrosis. Normal caliber and course of the common bile duct and pancreatic duct. No gallstones or common bile duct stones. 2. Associated marked inflammation of the second and third portions of the duodenum but no obstruction. 3. Small right renal cysts and small hemorrhagic cyst involving the left kidney. Electronically Signed   By: Rudie Meyer M.D.   On: 05/03/2017 13:07    Scheduled Meds: . cloNIDine  0.1 mg Oral Daily  . docusate sodium  100 mg Oral BID  . folic acid  1 mg Oral Daily  . multivitamin with minerals  1 tablet Oral Daily  . nicotine  21 mg Transdermal Daily  . pantoprazole (PROTONIX) IV  40 mg Intravenous Q12H  . potassium chloride  40 mEq Oral BID  . thiamine  100 mg Oral Daily   Or  . thiamine  100 mg Intravenous Daily   Continuous Infusions: . sodium chloride 50 mL/hr at 05/03/17 2232    Assessment/Plan:  1. Acute Pancreatitis-IVF, pain control, mri showed inflammation pancreas But no pseudocyst or necrosis. I advanced to full liquid diet this morning. Advance to soft diet this evening. Reassess tomorrow for potential discharge. 2. Hypomagnesemia and hypokalemia replace potassium orally and magnesium IV 3. Alcohol abuse. Patient must stop alcohol.  4. Inflammation of the duodenum on Protonix IV. Patient tolerated liquid diet 5. Elevated liver function tests secondary to alcohol abuse 6. MRI of the kidneys showed a hemorrhagic cyst  Code Status:     Code Status Orders        Start     Ordered   04/30/17 2104  Full code  Continuous     04/30/17 2103    Code Status History    Date Active Date Inactive Code Status Order ID Comments User Context   This patient has a current code  status but no historical code status.     Family Communication: Spoke with fiance yesterday Disposition Plan: Potentially home on Friday  Time spent: 25 minutes  Renae Gloss, [employer]

## 2017-05-05 LAB — BASIC METABOLIC PANEL
ANION GAP: 7 (ref 5–15)
BUN: 10 mg/dL (ref 6–20)
CO2: 27 mmol/L (ref 22–32)
Calcium: 8.3 mg/dL — ABNORMAL LOW (ref 8.9–10.3)
Chloride: 98 mmol/L — ABNORMAL LOW (ref 101–111)
Creatinine, Ser: 0.81 mg/dL (ref 0.61–1.24)
GFR calc Af Amer: >60 mL/min (ref >60)
GFR calc non Af Amer: >60 mL/min (ref >60)
GLUCOSE: 109 mg/dL — AB (ref 65–99)
POTASSIUM: 3.4 mmol/L — AB (ref 3.5–5.1)
Sodium: 132 mmol/L — ABNORMAL LOW (ref 135–145)

## 2017-05-05 LAB — MAGNESIUM: Magnesium: 1.8 mg/dL (ref 1.7–2.4)

## 2017-05-05 MED ORDER — LACTULOSE 10 GM/15ML PO SOLN
30.0000 g | Freq: Once | ORAL | Status: AC
  Administered 2017-05-05: 30 g via ORAL
  Filled 2017-05-05: qty 60

## 2017-05-05 MED ORDER — ACETAMINOPHEN 325 MG PO TABS: 650.0000 mg | ORAL_TABLET | Freq: Four times a day (QID) | ORAL | Status: DC | PRN

## 2017-05-05 MED ORDER — POTASSIUM CHLORIDE CRYS ER 20 MEQ PO TBCR
40.0000 meq | EXTENDED_RELEASE_TABLET | Freq: Every day | ORAL | Status: DC
  Administered 2017-05-06 – 2017-05-08 (×3): 40 meq via ORAL
  Filled 2017-05-05 (×3): qty 2

## 2017-05-05 MED ORDER — OXYCODONE-ACETAMINOPHEN 5-325 MG PO TABS
1.0000 | ORAL_TABLET | Freq: Four times a day (QID) | ORAL | Status: DC | PRN
  Administered 2017-05-05 – 2017-05-06 (×3): 1 via ORAL
  Filled 2017-05-05 (×3): qty 1

## 2017-05-05 MED ORDER — MAGNESIUM SULFATE 2 GM/50ML IV SOLN
2.0000 g | Freq: Once | INTRAVENOUS | Status: AC
  Administered 2017-05-05: 2 g via INTRAVENOUS
  Filled 2017-05-05: qty 50

## 2017-05-05 NOTE — Progress Notes (Signed)
Patient ID: Nathan Herrera, male   DOB: 10-19-1960, 56 y.o.   MRN: [id number]   [facility] PROGRESS NOTE  Curren Mohrmann WUJ:[id number] DOB: 1960/11/22 DOA: 04/30/2017 PCP: Patient, No Pcp Per  HPI/Subjective: Patient states he had some pain last night after eating solid food. Stated this pain this morning is 6-7 out of 10 in intensity. Still with some constipation. Still with back pain.  Objective: Vitals:   05/05/17 0819 05/05/17 0935  BP: (!) 184/108 129/83  Pulse: 74   Resp: 18   Temp: 98.7 F (37.1 C)   SpO2: 98%     Filed Weights   04/30/17 1317 04/30/17 2102  Weight: 77.1 kg (170 lb) 79.8 kg (176 lb)    ROS: Review of Systems  Constitutional: Negative for chills and fever.  Eyes: Negative for blurred vision.  Respiratory: Negative for cough and shortness of breath.   Cardiovascular: Negative for chest pain.  Gastrointestinal: Positive for abdominal pain and constipation. Negative for diarrhea, nausea and vomiting.  Genitourinary: Negative for dysuria.  Musculoskeletal: Positive for back pain and joint pain.  Neurological: Negative for dizziness and headaches.   Exam: Physical Exam  Constitutional: He is oriented to person, place, and time.  HENT:  Nose: No mucosal edema.  Mouth/Throat: No oropharyngeal exudate or posterior oropharyngeal edema.  Eyes: Pupils are equal, round, and reactive to light. Conjunctivae, EOM and lids are normal.  Neck: No JVD present. Carotid bruit is not present. No edema present. No thyroid mass and no thyromegaly present.  Cardiovascular: S1 normal and S2 normal.  Exam reveals no gallop.   No murmur heard. Pulses:      Dorsalis pedis pulses are 2+ on the right side, and 2+ on the left side.  Respiratory: No respiratory distress. He has no wheezes. He has no rhonchi. He has no rales.  GI: Soft. Bowel sounds are normal. There is tenderness.  Musculoskeletal:       Right ankle: He exhibits no swelling.       Left ankle: He exhibits no  swelling.  Lymphadenopathy:    He has no cervical adenopathy.  Neurological: He is alert and oriented to person, place, and time. No cranial nerve deficit.  Skin: Skin is warm. No rash noted. Nails show no clubbing.  Psychiatric: He has a normal mood and affect.      Data Reviewed: Basic Metabolic Panel:  Recent Labs Lab 04/30/17 1323 05/02/17 0928 05/04/17 0437 05/05/17 0253  NA 125* 136 134* 132*  K 3.4* 3.2* 2.9* 3.4*  CL 95* 107 96* 98*  CO2 19* 23 30 27   GLUCOSE 98 115* 90 109*  BUN 10 8 8 10   CREATININE 0.76 0.75 0.66 0.81  CALCIUM 8.0* 7.0* 8.4* 8.3*  MG  --   --  1.7 1.8   Liver Function Tests:  Recent Labs Lab 04/30/17 1323 05/02/17 0928 05/04/17 0437  AST 245* 82* 55*  ALT 130* 54 43  ALKPHOS 101 57 59  BILITOT 0.9 3.2* 2.3*  PROT 5.7* 5.4* 6.3*  ALBUMIN 3.7 2.4* 2.6*    Recent Labs Lab 04/30/17 1323 05/02/17 0928 05/03/17 0815 05/04/17 0437  LIPASE 254* 58* 49 46   CBC:  Recent Labs Lab 04/30/17 1323 05/01/17 0311 05/01/17 0802 05/04/17 0437  WBC 15.2* 12.7* 11.5* 9.5  HGB 18.8* 17.7 16.9 14.4  HCT 49.5 48.7 46.1 40.6  MCV 97.6 96.5 96.3 97.4  PLT 172 143* 157 133*   Cardiac Enzymes:  Recent Labs Lab 04/30/17 1323  TROPONINI <0.03    Studies: No results found.  Scheduled Meds: . amLODipine  5 mg Oral Daily  . docusate sodium  100 mg Oral BID  . folic acid  1 mg Oral Daily  . multivitamin with minerals  1 tablet Oral Daily  . nicotine  21 mg Transdermal Daily  . pantoprazole (PROTONIX) IV  40 mg Intravenous Q12H  . thiamine  100 mg Oral Daily   Or  . thiamine  100 mg Intravenous Daily    Assessment/Plan:  1. Abdominal pain generalized. Acute Pancreatitis- I placed back on liquid diet. Lipase normalized 2 days ago but still having pain. CT scan showed inflammation around the duodenum which could be causing some pain. I have on IV Protonix. I will get a GI consult for consideration for endoscopy. I'm hesitant on  sending the patient home today secondary to his pain scale. 2. Hypomagnesemia and hypokalemia replace potassium orally and magnesium IV 3. Alcohol abuse. Patient must stop alcohol. Patient does not want Antabuse 4. Inflammation of the duodenum on Protonix IV. 5. Elevated liver function tests secondary to alcohol abuse 6. MRI of the kidneys showed a hemorrhagic cyst  Code Status:     Code Status Orders        Start     Ordered   04/30/17 2104  Full code  Continuous     04/30/17 2103    Code Status History    Date Active Date Inactive Code Status Order ID Comments User Context   This patient has a current code status but no historical code status.     Family Communication: Spoke with fiance today on phone, Family member came up to me and states he drinks 30 beers a day Disposition Plan: Evaluate daily for going home  Time spent: 25 minutes  Alford Highland  [employer]

## 2017-05-06 LAB — BASIC METABOLIC PANEL
Anion gap: 8 (ref 5–15)
BUN: 8 mg/dL (ref 6–20)
CALCIUM: 8.7 mg/dL — AB (ref 8.9–10.3)
CO2: 28 mmol/L (ref 22–32)
Chloride: 99 mmol/L — ABNORMAL LOW (ref 101–111)
Creatinine, Ser: 0.68 mg/dL (ref 0.61–1.24)
GFR calc Af Amer: >60 mL/min (ref >60)
GLUCOSE: 98 mg/dL (ref 65–99)
Potassium: 3.4 mmol/L — ABNORMAL LOW (ref 3.5–5.1)
Sodium: 135 mmol/L (ref 135–145)

## 2017-05-06 LAB — CBC
HCT: 39.7 % — ABNORMAL LOW (ref 40.0–52.0)
Hemoglobin: 14 g/dL (ref 13.0–18.0)
MCH: 33.8 pg (ref 26.0–34.0)
MCHC: 35.3 g/dL (ref 32.0–36.0)
MCV: 96 fL (ref 80.0–100.0)
PLATELETS: 226 10*3/uL (ref 150–440)
RBC: 4.14 MIL/uL — ABNORMAL LOW (ref 4.40–5.90)
RDW: 12.7 % (ref 11.5–14.5)
WBC: 10.3 10*3/uL (ref 3.8–10.6)

## 2017-05-06 LAB — LIPASE, BLOOD: LIPASE: 60 U/L — AB (ref 11–51)

## 2017-05-06 MED ORDER — SODIUM CHLORIDE 0.9 % IV SOLN
INTRAVENOUS | Status: AC
  Administered 2017-05-06: 14:00:00 via INTRAVENOUS

## 2017-05-06 MED ORDER — OXYCODONE-ACETAMINOPHEN 5-325 MG PO TABS
1.0000 | ORAL_TABLET | Freq: Four times a day (QID) | ORAL | Status: DC | PRN
  Administered 2017-05-06: 1 via ORAL
  Administered 2017-05-06 – 2017-05-07 (×2): 2 via ORAL
  Administered 2017-05-07 – 2017-05-08 (×4): 1 via ORAL
  Filled 2017-05-06 (×6): qty 1
  Filled 2017-05-06: qty 2
  Filled 2017-05-06: qty 1

## 2017-05-06 NOTE — Progress Notes (Signed)
[facility] - [place] at [facility]   PATIENT NAME: Nathan Herrera    MR#:  [id number]  DATE OF BIRTH:  11/29/1960  SUBJECTIVE:  CHIEF COMPLAINT:   Chief Complaint  Patient presents with  . Chest Pain  . Abdominal Pain   -Still complains of significant right flank pain, right upper quadrant abdominal pain radiating to his back. -Remains nothing by mouth. Awaiting GI consult  REVIEW OF SYSTEMS:  Review of Systems  Constitutional: Positive for malaise/fatigue. Negative for chills and fever.  HENT: Negative for congestion, ear discharge, ear pain, hearing loss and nosebleeds.   Eyes: Negative for blurred vision and double vision.  Respiratory: Negative for cough, shortness of breath and wheezing.   Cardiovascular: Negative for chest pain, palpitations and leg swelling.  Gastrointestinal: Positive for abdominal pain, constipation and nausea. Negative for diarrhea and vomiting.  Genitourinary: Positive for flank pain. Negative for dysuria.  Musculoskeletal: Negative for myalgias.  Neurological: Negative for dizziness, sensory change, speech change, focal weakness, seizures and headaches.  Psychiatric/Behavioral: Negative for depression.    DRUG ALLERGIES:  No Known Allergies  VITALS:  Blood pressure (!) 149/92, pulse 79, temperature 98.5 F (36.9 C), temperature source Oral, resp. rate 18, height 5\' 7"  (1.702 m), weight 79.8 kg (176 lb), SpO2 93 %.  PHYSICAL EXAMINATION:  Physical Exam  GENERAL:  56 y.o.-year-old patient lying in the bed with no acute distress.  EYES: Pupils equal, round, reactive to light and accommodation. No scleral icterus. Extraocular muscles intact.  HEENT: Head atraumatic, normocephalic. Oropharynx and nasopharynx clear.  NECK:  Supple, no jugular venous distention. No thyroid enlargement, no tenderness.  LUNGS: Normal breath sounds bilaterally, no wheezing, rales,rhonchi or crepitation. No use of accessory muscles of respiration.    CARDIOVASCULAR: S1, S2 normal. No murmurs, rubs, or gallops.  ABDOMEN: Soft, nondistended, tender in the right upper quadrant, mid abdomen and also bilateral flanks.. Bowel sounds present. No organomegaly or mass.  EXTREMITIES: No pedal edema, cyanosis, or clubbing.  NEUROLOGIC: Cranial nerves II through XII are intact. Muscle strength 5/5 in all extremities. Sensation intact. Gait not checked.  PSYCHIATRIC: The patient is alert and oriented x 3.  SKIN: No obvious rash, lesion, or ulcer.    LABORATORY PANEL:   CBC  Recent Labs Lab 05/06/17 0310  WBC 10.3  HGB 14.0  HCT 39.7*  PLT 226   ------------------------------------------------------------------------------------------------------------------  Chemistries   Recent Labs Lab 05/04/17 0437 05/05/17 0253 05/06/17 0310  NA 134* 132* 135  K 2.9* 3.4* 3.4*  CL 96* 98* 99*  CO2 30 27 28   GLUCOSE 90 109* 98  BUN 8 10 8   CREATININE 0.66 0.81 0.68  CALCIUM 8.4* 8.3* 8.7*  MG 1.7 1.8  --   AST 55*  --   --   ALT 43  --   --   ALKPHOS 59  --   --   BILITOT 2.3*  --   --    ------------------------------------------------------------------------------------------------------------------  Cardiac Enzymes  Recent Labs Lab 04/30/17 1323  TROPONINI <0.03   ------------------------------------------------------------------------------------------------------------------  RADIOLOGY:  No results found.  EKG:   Orders placed or performed during the hospital encounter of 04/30/17  . EKG 12-Lead  . EKG 12-Lead  . ED EKG within 10 minutes  . ED EKG within 10 minutes    ASSESSMENT AND PLAN:   56 year old male with no significant past medical history except alcohol abuse presents to hospital secondary to epigastric pain with nausea and vomiting.   #1 acute  pancreatitis-likely alcohol-induced pancreatitis. CT of the abdomen with edema of the head and uncinate process of pancreas. -Diffuse hepatic steatosis noted.  No gallstones or gallbladder dilatation noted. -Continue nothing by mouth for now. Start IV fluids. Pain medications. -GI consult pending -No peripancreatic abscess noted  #2 acute duodenitis-inflammation of the duodenum noted likely at the pancreatic opening. -Already on IV Protonix. Continue fluids, nothing by mouth for now -GI consult pending -Takes BC powders for pain at home.  #3 hypokalemia-being replaced  #4 hypertension-Norvasc  #5 Tobacco use disorder- nicotine patch  #6 DVT prophylaxis- patient is ambulatory   All the records are reviewed and case discussed with Care Management/Social Workerr. Management plans discussed with the patient, family and they are in agreement.  CODE STATUS: Full code  TOTAL TIME TAKING CARE OF THIS PATIENT: 37 minutes.   POSSIBLE D/C IN 1-2 DAYS, DEPENDING ON CLINICAL CONDITION.   Nathan Herrera M.D on 05/06/2017 at 10:44 AM  Between 7am to 6pm - Pager - [phone]  After 6pm go to [email or web address] - password [employer]  [facility]  Office  [phone]  CC: Primary care physician; Patient, No Pcp Per

## 2017-05-06 NOTE — Consult Note (Addendum)
Referring Provider: Dr. Nemiah Commander Primary Care Physician:  Patient, No Pcp Per Primary Gastroenterologist:  Gentry Fitz  Reason for Consultation:  Pancreatitis  HPI: Nathan Herrera is a 56 y.o. male with a history of alcohol abuse (drinks 6-20 beers per day for years) was admitted earlier this week with his first known episode of acute pancreatitis with severe upper abdominal pain, nausea, and vomiting. CT and MRI showed pancreatic head/uncinate process inflammation with duodenal inflammation of the 2nd and 3rd portions. He had worsened abdominal pain yesterday with full liquids and a GI consult was called. Reports pain overnight that was relieved with pain meds and recently received pain meds. Reports loose nonbloody stools this week. Wife at bedside.  Past Medical History:  Diagnosis Date  . Hepatitis   . Hypertension     History reviewed. No pertinent surgical history.  Prior to Admission medications   Not on File    Scheduled Meds: . amLODipine  5 mg Oral Daily  . docusate sodium  100 mg Oral BID  . folic acid  1 mg Oral Daily  . multivitamin with minerals  1 tablet Oral Daily  . nicotine  21 mg Transdermal Daily  . pantoprazole (PROTONIX) IV  40 mg Intravenous Q12H  . potassium chloride  40 mEq Oral Daily  . thiamine  100 mg Oral Daily   Or  . thiamine  100 mg Intravenous Daily   Continuous Infusions: . sodium chloride     PRN Meds:.acetaminophen, morphine injection, ondansetron **OR** ondansetron (ZOFRAN) IV, oxyCODONE-acetaminophen  Allergies as of 04/30/2017  . (No Known Allergies)    History reviewed. No pertinent family history.  Social History   Social History  . Marital status: Married    Spouse name: N/A  . Number of children: N/A  . Years of education: N/A   Occupational History  . Not on file.   Social History Main Topics  . Smoking status: Current Every Day Smoker    Packs/day: 0.50  . Smokeless tobacco: Never Used  . Alcohol use No     Comment:  3-4 beers daily  . Drug use: No  . Sexual activity: Not on file   Other Topics Concern  . Not on file   Social History Narrative  . No narrative on file    Review of Systems: All negative except as stated above in HPI.  Physical Exam: Vital signs: Vitals:   05/06/17 0726 05/06/17 0917  BP: 138/89 (!) 149/92  Pulse: 84 79  Resp:    Temp:  98.5 F (36.9 C)  SpO2:  93%  R 18  Last BM Date: 05/05/17 General:  Lethargic, Well-developed, well-nourished, pleasant and cooperative in NAD HEENT: anicteric sclera, oropharynx clear Neck: supple, nontender Lungs:  Clear throughout to auscultation.   No wheezes, crackles, or rhonchi. No acute distress. Heart:  Regular rate and rhythm; no murmurs, clicks, rubs,  or gallops. Abdomen: upper quadrant tenderness (greatest in epigastric region) with guarding, soft, nondistended, +BS  Rectal:  Deferred Ext: no edema  GI:  Lab Results:  Recent Labs  05/04/17 0437 05/06/17 0310  WBC 9.5 10.3  HGB 14.4 14.0  HCT 40.6 39.7*  PLT 133* 226   BMET  Recent Labs  05/04/17 0437 05/05/17 0253 05/06/17 0310  NA 134* 132* 135  K 2.9* 3.4* 3.4*  CL 96* 98* 99*  CO2 30 27 28   GLUCOSE 90 109* 98  BUN 8 10 8   CREATININE 0.66 0.81 0.68  CALCIUM 8.4* 8.3* 8.7*  LFT  Recent Labs  05/04/17 0437  PROT 6.3*  ALBUMIN 2.6*  AST 55*  ALT 43  ALKPHOS 59  BILITOT 2.3*  BILIDIR 1.0*  IBILI 1.3*   PT/INR No results for input(s): LABPROT, INR in the last 72 hours.   Studies/Results: No results found.  Impression/Plan: 56 yo with acute pancreatitis likely due to alcohol. Elevated LFTs on admit have normalized and may have had a component of alcoholic hepatitis. Suspect duodenal inflammation is reactive from pancreatitis and I do not think an EGD is needed at this time. He expresses that he is going to stop drinking. Needs to avoid NSAIDs. Clears ok without advancing diet right now. Increase IVFs to 100 cc/hr. Will need an EUS in 6-8  weeks as an outpt to further evaluate pancreas and duodenal can be evaluated at that point. Continue supportive care.    LOS: 6 days   [name] C.  05/06/2017, 1:05 PM

## 2017-05-07 LAB — COMPREHENSIVE METABOLIC PANEL
ALT: 39 U/L (ref 17–63)
AST: 38 U/L (ref 15–41)
Albumin: 2.8 g/dL — ABNORMAL LOW (ref 3.5–5.0)
Alkaline Phosphatase: 69 U/L (ref 38–126)
Anion gap: 6 (ref 5–15)
BUN: 9 mg/dL (ref 6–20)
CHLORIDE: 102 mmol/L (ref 101–111)
CO2: 29 mmol/L (ref 22–32)
Calcium: 8.8 mg/dL — ABNORMAL LOW (ref 8.9–10.3)
Creatinine, Ser: 0.92 mg/dL (ref 0.61–1.24)
Glucose, Bld: 90 mg/dL (ref 65–99)
POTASSIUM: 3.7 mmol/L (ref 3.5–5.1)
Sodium: 137 mmol/L (ref 135–145)
TOTAL PROTEIN: 6.6 g/dL (ref 6.5–8.1)
Total Bilirubin: 1.2 mg/dL (ref 0.3–1.2)

## 2017-05-07 MED ORDER — PANTOPRAZOLE SODIUM 40 MG PO TBEC
40.0000 mg | DELAYED_RELEASE_TABLET | Freq: Every day | ORAL | Status: DC
  Administered 2017-05-08: 40 mg via ORAL
  Filled 2017-05-07: qty 1

## 2017-05-07 NOTE — Progress Notes (Signed)
Gastroenterology Progress Note    Nathan Herrera 56 y.o. Feb 21, 1961   Subjective: Feeling better. Less abdominal pain. Denies N/V. Tolerating full liquids. Multiple family members in room.  Objective: Vital signs in last 24 hours: Vitals:   05/07/17 0925 05/07/17 1244  BP: 138/80 (!) 180/83  Pulse: 78 71  Resp:  18  Temp: 98.4 F (36.9 C) 98.1 F (36.7 C)  SpO2: 99% 96%    Physical Exam: Gen: alert, no acute distress CV: RRR Chest: CTA B Abd: epigastric, RUQ and RLQ tenderness with guarding (less tender), soft, nondistended, +BS Ext: no edema  Lab Results:  Recent Labs  05/05/17 0253 05/06/17 0310 05/07/17 0355  NA 132* 135 137  K 3.4* 3.4* 3.7  CL 98* 99* 102  CO2 27 28 29   GLUCOSE 109* 98 90  BUN 10 8 9   CREATININE 0.81 0.68 0.92  CALCIUM 8.3* 8.7* 8.8*  MG 1.8  --   --     Recent Labs  05/07/17 0355  AST 38  ALT 39  ALKPHOS 69  BILITOT 1.2  PROT 6.6  ALBUMIN 2.8*    Recent Labs  05/06/17 0310  WBC 10.3  HGB 14.0  HCT 39.7*  MCV 96.0  PLT 226   No results for input(s): LABPROT, INR in the last 72 hours.    Assessment/Plan: Acute pancreatitis likely due to alcohol - improving. Consider advancing diet to low fat diet tomorrow if tolerates full liquids today. May need an outpt EUS/EGD in 8-12 weeks to evaluate for other sources of pancreatitis and to evaluate the duodenal inflammation (suspect it is reactive from pancreatitis) but defer to Dr. Tobi Herrera. Dr. Tobi Herrera to f/u tomorrow.   Nathan Herrera C. 05/07/2017, 3:19 PMPatient ID: Nathan Herrera, male   DOB: 15-Sep-1960, 56 y.o.   MRN: [id number]

## 2017-05-07 NOTE — Clinical Social Work Note (Signed)
CSW met with the HCPOA/sister of the patient at her request to provide emotional support and a list of area substance use resources, including resources for detox and residential services. The patient's sister advised the CSW that the patient has a long history of ETOH use beginning at least 35 years ago as well as past cocaine use that ceased 8 years ago. The patient drinks 30-44 beers daily, on average, and has a history of hiding his drinking by restocking his refrigerator and burying his cans/bottles in the backyard so that his girlfriend is not aware. The patient's sister wants to help her brother with sustained change, and she is afraid that he will pass away due to alcohol related symptoms. The sister asked if she could use Therapist, art as a way to compel the patient into residential services; the CSW advised that the patient has not been deemed incapable of making his own decisions and that he has a right to refuse service, even if the refusal is not in his best interest.  The CSW provided emotional support and education on the stages of change, grief reactions, and defense mechanisms such as denial, sublimation, and splitting. The patient's sister thanked the CSW, and she will contact the CSW after discharge should she have remaining questions.  Santiago Bumpers, MSW, Latanya Presser [phone]

## 2017-05-07 NOTE — Progress Notes (Signed)
[facility] - [place] at [facility]   PATIENT NAME: Nathan Herrera    MR#:  [id number]  DATE OF BIRTH:  1961-06-30  SUBJECTIVE:  CHIEF COMPLAINT:   Chief Complaint  Patient presents with  . Chest Pain  . Abdominal Pain   -feels much better today. BP well controlled - tolerating clears well  REVIEW OF SYSTEMS:  Review of Systems  Constitutional: Positive for malaise/fatigue. Negative for chills and fever.  HENT: Negative for congestion, ear discharge, ear pain, hearing loss and nosebleeds.   Eyes: Negative for blurred vision and double vision.  Respiratory: Negative for cough, shortness of breath and wheezing.   Cardiovascular: Negative for chest pain, palpitations and leg swelling.  Gastrointestinal: Positive for abdominal pain and nausea. Negative for constipation, diarrhea and vomiting.  Genitourinary: Positive for flank pain. Negative for dysuria.  Musculoskeletal: Negative for myalgias.  Neurological: Negative for dizziness, sensory change, speech change, focal weakness, seizures and headaches.  Psychiatric/Behavioral: Negative for depression.    DRUG ALLERGIES:  No Known Allergies  VITALS:  Blood pressure 138/80, pulse 78, temperature 98.4 F (36.9 C), temperature source Oral, resp. rate 20, height 5\' 7"  (1.702 m), weight 79.8 kg (176 lb), SpO2 99 %.  PHYSICAL EXAMINATION:  Physical Exam  GENERAL:  56 y.o.-year-old patient lying in the bed with no acute distress.  EYES: Pupils equal, round, reactive to light and accommodation. No scleral icterus. Extraocular muscles intact.  HEENT: Head atraumatic, normocephalic. Oropharynx and nasopharynx clear.  NECK:  Supple, no jugular venous distention. No thyroid enlargement, no tenderness.  LUNGS: Normal breath sounds bilaterally, no wheezing, rales,rhonchi or crepitation. No use of accessory muscles of respiration.  CARDIOVASCULAR: S1, S2 normal. No murmurs, rubs, or gallops.  ABDOMEN: Soft, nondistended,  tender in the right upper quadrant, mid abdomen and also bilateral flanks.. Bowel sounds present. No organomegaly or mass.  EXTREMITIES: No pedal edema, cyanosis, or clubbing.  NEUROLOGIC: Cranial nerves II through XII are intact. Muscle strength 5/5 in all extremities. Sensation intact. Gait not checked.  PSYCHIATRIC: The patient is alert and oriented x 3.  SKIN: No obvious rash, lesion, or ulcer.    LABORATORY PANEL:   CBC  Recent Labs Lab 05/06/17 0310  WBC 10.3  HGB 14.0  HCT 39.7*  PLT 226   ------------------------------------------------------------------------------------------------------------------  Chemistries   Recent Labs Lab 05/05/17 0253  05/07/17 0355  NA 132*  < > 137  K 3.4*  < > 3.7  CL 98*  < > 102  CO2 27  < > 29  GLUCOSE 109*  < > 90  BUN 10  < > 9  CREATININE 0.81  < > 0.92  CALCIUM 8.3*  < > 8.8*  MG 1.8  --   --   AST  --   --  38  ALT  --   --  39  ALKPHOS  --   --  69  BILITOT  --   --  1.2  < > = values in this interval not displayed. ------------------------------------------------------------------------------------------------------------------  Cardiac Enzymes  Recent Labs Lab 04/30/17 1323  TROPONINI <0.03   ------------------------------------------------------------------------------------------------------------------  RADIOLOGY:  No results found.  EKG:   Orders placed or performed during the hospital encounter of 04/30/17  . EKG 12-Lead  . EKG 12-Lead  . ED EKG within 10 minutes  . ED EKG within 10 minutes    ASSESSMENT AND PLAN:   56 year old male with no significant past medical history except alcohol abuse presents to hospital secondary  to epigastric pain with nausea and vomiting.  #1 acute pancreatitis-likely alcohol-induced pancreatitis. CT of the abdomen with edema of the head and uncinate process of pancreas. -Diffuse hepatic steatosis noted. No gallstones or gallbladder dilatation noted. -improving  slowly, tolerating clears well. Advance to full liquids -No peripancreatic abscess noted  #2 acute duodenitis-inflammation of the duodenum noted likely due to acute panreatitis -change to oral protonix. Continue fluids, nothing by mouth for now -GI consult appreciated, no plans for EGD -Advised against using BC powders and NSAIDS for pain at home.  #3 hypokalemia-being replaced  #4 hypertension-Norvasc- will need at discharge as well  #5 Tobacco use disorder- nicotine patch  #6 DVT prophylaxis- patient is ambulatory   Updated sister at bedside today   All the records are reviewed and case discussed with Care Management/Social Workerr. Management plans discussed with the patient, family and they are in agreement.  CODE STATUS: Full code  TOTAL TIME TAKING CARE OF THIS PATIENT: 36 minutes.   POSSIBLE D/C TOMORROW, DEPENDING ON CLINICAL CONDITION.   Elisavet Buehrer M.D on 05/07/2017 at 12:03 PM  Between 7am to 6pm - Pager - [phone]  After 6pm go to [email or web address] - password [employer]  [facility]  Office  [phone]  CC: Primary care physician; Patient, No Pcp Per

## 2017-05-08 DIAGNOSIS — K852 Alcohol induced acute pancreatitis without necrosis or infection: Principal | ICD-10-CM

## 2017-05-08 LAB — TRIGLYCERIDES: Triglycerides: 191 mg/dL — ABNORMAL HIGH (ref <150)

## 2017-05-08 MED ORDER — AMLODIPINE BESYLATE 5 MG PO TABS: 5.0000 mg | ORAL_TABLET | Freq: Every day | ORAL | 0 refills | Status: AC

## 2017-05-08 MED ORDER — OXYCODONE-ACETAMINOPHEN 5-325 MG PO TABS: 1.0000 | ORAL_TABLET | Freq: Four times a day (QID) | ORAL | 0 refills | Status: AC | PRN

## 2017-05-08 MED ORDER — PANTOPRAZOLE SODIUM 40 MG PO TBEC: 40.0000 mg | DELAYED_RELEASE_TABLET | Freq: Every day | ORAL | 2 refills | Status: AC

## 2017-05-08 NOTE — Progress Notes (Signed)
A&O x4. VSS. D/C'd today. IV removed intact. Went over d/c instructions.

## 2017-05-08 NOTE — Progress Notes (Signed)
   [name] MD, MRCP(U.K) [address]  [address]  [place], [place] [address]  Main: [phone]    Nathan Herrera is being followed for acute pancreatitis  Day 2 of follow up   Subjective: Still has some abdominal pain but better    Objective: Vital signs in last 24 hours: Vitals:   05/06/17 1936 05/07/17 0925 05/07/17 1244 05/07/17 2108  BP: 137/90 138/80 (!) 180/83 (!) 142/81  Pulse: 80 78 71 84  Resp: 20  18 16   Temp: 98.5 F (36.9 C) 98.4 F (36.9 C) 98.1 F (36.7 C) 98.6 F (37 C)  TempSrc: Oral Oral Oral Oral  SpO2: 98% 99% 96% 96%  Weight:      Height:       Weight change:   Intake/Output Summary (Last 24 hours) at 05/08/17 0923 Last data filed at 05/08/17 0418  Gross per 24 hour  Intake              720 ml  Output              600 ml  Net              120 ml     Exam: Heart:: Regular rate and rhythm, S1S2 present or without murmur or extra heart sounds Lungs: normal, clear to auscultation and clear to auscultation and percussion Abdomen: mild epigastric tenderness, no guarding or rigidity , BS+   Lab Results: @LABTEST2 @ Micro Results: No results found for this or any previous visit (from the past 240 hour(s)). Studies/Results: No results found. Medications: I have reviewed the patient's current medications. Scheduled Meds: . amLODipine  5 mg Oral Daily  . docusate sodium  100 mg Oral BID  . folic acid  1 mg Oral Daily  . multivitamin with minerals  1 tablet Oral Daily  . nicotine  21 mg Transdermal Daily  . pantoprazole  40 mg Oral QAC breakfast  . potassium chloride  40 mEq Oral Daily  . thiamine  100 mg Oral Daily   Or  . thiamine  100 mg Intravenous Daily   Continuous Infusions: PRN Meds:.acetaminophen, morphine injection, ondansetron **OR** ondansetron (ZOFRAN) IV, oxyCODONE-acetaminophen   Assessment: Active Problems:   Pancreatitis  Nathan Herrera 56 y.o. male admitted on 05/05/17 with acute alcoholic pancreatitis - 2nd episode  .MRCP 05/03/17 showed acute pancreatitis , inflammation of the duodenum .   Plan: 1. Stop all alcohol consumption  2. No NSAID's 3. PPI 4. Advance diet as tolerated  5. EGD in 8 weeks as an outpatient to evaluate duodenal inflammation  6. Check IGG4 levels, TGL levels.  7. Stop smoking  8. He would be ready for discharge when tolerating a diet with minimal discomfort- still has some pain today    LOS: 8 days   [name] 05/08/2017, 8:33 AM

## 2017-05-08 NOTE — Discharge Summary (Signed)
[facility] [place] [facility]   PATIENT NAME: Nathan Herrera    MR#:  [id number]  DATE OF BIRTH:  10/18/1960  DATE OF ADMISSION:  04/30/2017   ADMITTING PHYSICIAN: Gracelyn Nurse, MD  DATE OF DISCHARGE: 05/08/2017  PRIMARY CARE PHYSICIAN: Patient, No Pcp Per   ADMISSION DIAGNOSIS:   Hyponatremia [E87.1] Pain of upper abdomen [R10.10] Alcohol-induced acute pancreatitis, unspecified complication status [K85.20]  DISCHARGE DIAGNOSIS:   Active Problems:   Pancreatitis   SECONDARY DIAGNOSIS:   Past Medical History:  Diagnosis Date  . Hepatitis   . Hypertension     HOSPITAL COURSE:   56 year old male with no significant past medical history except alcohol abuse presents to hospital secondary to epigastric pain with nausea and vomiting.  #1 acute pancreatitis-  alcohol-induced pancreatitis. CT of the abdomen with edema of the head and uncinate process of pancreas. -Diffuse hepatic steatosis noted. No gallstones or gallbladder dilatation noted. -improving slowly, tolerating liquid diet well. Advance to low fat diet today -No peripancreatic abscess noted - anticipate discharge today  #2 acute duodenitis-inflammation of the duodenum noted likely due to acute panreatitis -continue oral protonix.  -GI consult appreciated, EGD vs EUS as outpatient in 6 weeks -Advised against using BC powders and NSAIDS for pain at home.  #3 hypokalemia-replaced  #4 hypertension-Norvasc- will need at discharge as well  #5 Tobacco use disorder- nicotine patch in the hospital- counseled at discharge   Discharge today and outpatient GI follow up.   DISCHARGE CONDITIONS:   Guarded CONSULTS OBTAINED:   Treatment Team:  Toney Reil, MD  DRUG ALLERGIES:   No Known Allergies DISCHARGE MEDICATIONS:   Allergies as of 05/08/2017   No Known Allergies     Medication List    TAKE these medications   amLODipine 5 MG tablet Commonly known as:   NORVASC Take 1 tablet (5 mg total) by mouth daily.   oxyCODONE-acetaminophen 5-325 MG tablet Commonly known as:  PERCOCET/ROXICET Take 1 tablet by mouth every 6 (six) hours as needed for moderate pain.   pantoprazole 40 MG tablet Commonly known as:  PROTONIX Take 1 tablet (40 mg total) by mouth daily before breakfast.            Discharge Care Instructions        Start     Ordered   05/09/17 0000  amLODipine (NORVASC) 5 MG tablet  Daily     05/08/17 1105   05/09/17 0000  pantoprazole (PROTONIX) 40 MG tablet  Daily before breakfast     05/08/17 1105   05/08/17 0000  oxyCODONE-acetaminophen (PERCOCET/ROXICET) 5-325 MG tablet  Every 6 hours PRN     05/08/17 1105   05/08/17 0000  Diet - low sodium heart healthy     05/08/17 1105   05/08/17 0000  Activity as tolerated - No restrictions     05/08/17 1105       DISCHARGE INSTRUCTIONS:   1. PCP follow-up in 1-2 weeks 2. GI follow up in 3-4 weeks  DIET:   Cardiac diet  ACTIVITY:   Activity as tolerated  OXYGEN:   Home Oxygen: No.  Oxygen Delivery: room air  DISCHARGE LOCATION:   home   If you experience worsening of your admission symptoms, develop shortness of breath, life threatening emergency, suicidal or homicidal thoughts you must seek medical attention immediately by calling 911 or calling your MD immediately  if symptoms less severe.  You Must read complete instructions/literature along with all the  possible adverse reactions/side effects for all the Medicines you take and that have been prescribed to you. Take any new Medicines after you have completely understood and accpet all the possible adverse reactions/side effects.   Please note  You were cared for by a hospitalist during your hospital stay. If you have any questions about your discharge medications or the care you received while you were in the hospital after you are discharged, you can call the unit and asked to speak with the hospitalist on  call if the hospitalist that took care of you is not available. Once you are discharged, your primary care physician will handle any further medical issues. Please note that NO REFILLS for any discharge medications will be authorized once you are discharged, as it is imperative that you return to your primary care physician (or establish a relationship with a primary care physician if you do not have one) for your aftercare needs so that they can reassess your need for medications and monitor your lab values.    On the day of Discharge:  VITAL SIGNS:   Blood pressure (!) 142/81, pulse 84, temperature 98.6 F (37 C), temperature source Oral, resp. rate 16, height 5\' 7"  (1.702 m), weight 79.8 kg (176 lb), SpO2 96 %.  PHYSICAL EXAMINATION:    GENERAL:  56 y.o.-year-old patient lying in the bed with no acute distress.  EYES: Pupils equal, round, reactive to light and accommodation. No scleral icterus. Extraocular muscles intact.  HEENT: Head atraumatic, normocephalic. Oropharynx and nasopharynx clear.  NECK:  Supple, no jugular venous distention. No thyroid enlargement, no tenderness.  LUNGS: Normal breath sounds bilaterally, no wheezing, rales,rhonchi or crepitation. No use of accessory muscles of respiration.  CARDIOVASCULAR: S1, S2 normal. No murmurs, rubs, or gallops.  ABDOMEN: Soft, nondistended, minimal tenderness in mid abdomen. Bowel sounds present. No organomegaly or mass.  EXTREMITIES: No pedal edema, cyanosis, or clubbing.  NEUROLOGIC: Cranial nerves II through XII are intact. Muscle strength 5/5 in all extremities. Sensation intact. Gait not checked.  PSYCHIATRIC: The patient is alert and oriented x 3.  SKIN: No obvious rash, lesion, or ulcer.   DATA REVIEW:   CBC  Recent Labs Lab 05/06/17 0310  WBC 10.3  HGB 14.0  HCT 39.7*  PLT 226    Chemistries   Recent Labs Lab 05/05/17 0253  05/07/17 0355  NA 132*  < > 137  K 3.4*  < > 3.7  CL 98*  < > 102  CO2 27  < > 29    GLUCOSE 109*  < > 90  BUN 10  < > 9  CREATININE 0.81  < > 0.92  CALCIUM 8.3*  < > 8.8*  MG 1.8  --   --   AST  --   --  38  ALT  --   --  39  ALKPHOS  --   --  69  BILITOT  --   --  1.2  < > = values in this interval not displayed.   Microbiology Results  No results found for this or any previous visit.  RADIOLOGY:  No results found.   Management plans discussed with the patient, family and they are in agreement.  CODE STATUS:     Code Status Orders        Start     Ordered   04/30/17 2104  Full code  Continuous     04/30/17 2103    Code Status History    Date Active  Date Inactive Code Status Order ID Comments User Context   This patient has a current code status but no historical code status.      TOTAL TIME TAKING CARE OF THIS PATIENT: 37 minutes.    Minna Dumire M.D on 05/08/2017 at 11:05 AM  Between 7am to 6pm - Pager - [phone]  After 6pm go to [email or web address] - Social research officer, government  [facility] [place] [facility]  Office  [phone]  CC: Primary care physician; Patient, No Pcp Per   Note: This dictation was prepared with Dragon dictation along with smaller phrase technology. Any transcriptional errors that result from this process are unintentional.

## 2017-05-09 LAB — IGG 4: IGG 4: 22 mg/dL (ref 2–96)

## 2017-06-12 ENCOUNTER — Encounter: Payer: Self-pay | Admitting: Gastroenterology

## 2017-06-12 ENCOUNTER — Ambulatory Visit: Payer: Managed Care, Other (non HMO) | Admitting: Gastroenterology

## 2017-06-12 NOTE — Progress Notes (Deleted)
He is here today to follow up for pancreatitis after hospital discharge.   Summary of history :  Nathan Herrera 56 y.o. male admitted on 05/05/17 with acute alcoholic pancreatitis . This is his  2nd episode . He has a history of alochol abuse. Marland KitchenMRCP 05/03/17 showed acute pancreatitis , inflammation of the duodenum . IGG4 was normal   Interval history   05/08/2017-  10/1//2018      Plan: 1. Stop all alcohol consumption  2. Advance diet as tolerated  3. EGD in 8 weeks as an outpatient to evaluate duodenal inflammation  4. Check IGG4 levels, TGL levels.  5. Stop smoking

## 2019-06-03 IMAGING — MR MR ABDOMEN WO/W CM MRCP
9 of 20 series · 17 of 48 positions shown · IV contrast (multihance)
Comparison: CT scan 04/30/2017

CLINICAL DATA: Acute pancreatitis.

EXAM:
MRI ABDOMEN WITHOUT AND WITH CONTRAST (INCLUDING MRCP)
TECHNIQUE: Multiplanar multisequence MR imaging of the abdomen was performed
both before and after the administration of intravenous contrast.
Heavily T2-weighted images of the biliary and pancreatic ducts were
obtained, and three-dimensional MRCP images were rendered by post
processing.
CONTRAST:  16mL MULTIHANCE GADOBENATE DIMEGLUMINE 529 MG/ML IV SOLN

[Series 2: cor true fisp · coronal · 5.0mm · 0.82mm/px · 1 of 41 slices shown]
[im 1/41]
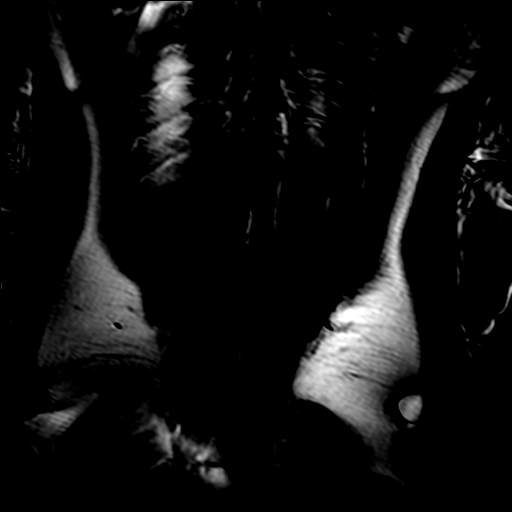

[Series 3: T2 · axial · 7.0mm · 1.48mm/px · 1 of 30 slices shown]
[im 1/30]
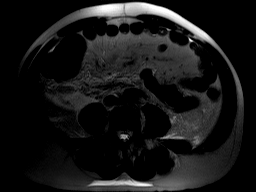

[Series 4: T2 fat-sat · axial · 7.0mm · 0.78mm/px · z∈[-76,+168]mm · 2 of 30 slices shown]
[im 1/30]
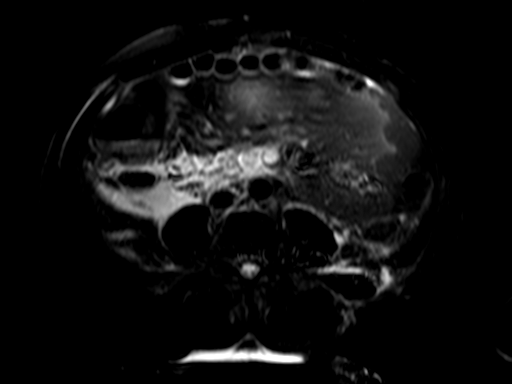
[im 30/30]
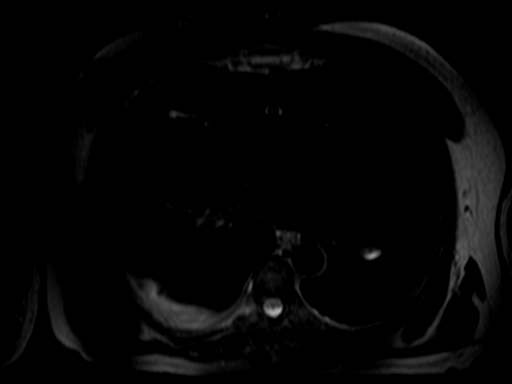

[Series 5: axial in-out of · axial · 7.0mm · 0.74mm/px · z∈[-76,+168]mm · 3 of 60 slices shown]
[im 1/60]
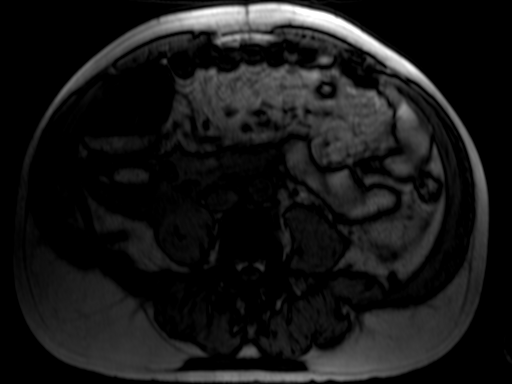
[im 30/60]
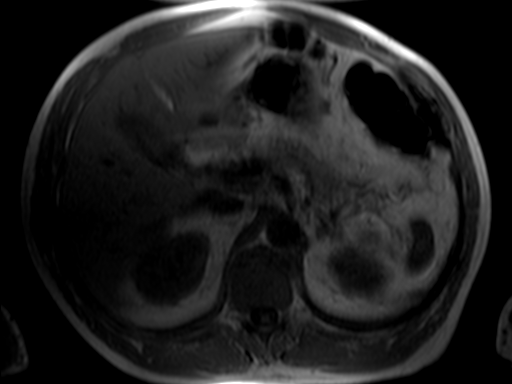
[im 60/60]
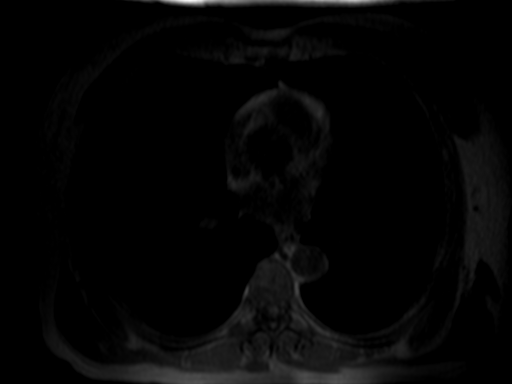

[Series 6: DWI · axial · 6.0mm · 2.97mm/px · z∈[-67,+177]mm · 5 of 104 slices shown]
[im 1/104]
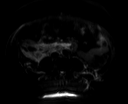
[im 26/104]
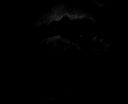
[im 52/104]
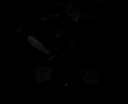
[im 78/104]
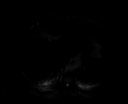
[im 104/104]
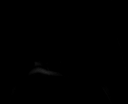

[Series 7: axial dwi_adc · axial · 6.0mm · 2.97mm/px · z∈[-67,+177]mm · 2 of 35 slices shown]
[im 1/35]
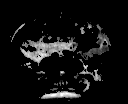
[im 35/35]
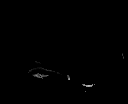

[Series 11: cor thins · coronal · 3.0mm · 0.74mm/px · 1 of 25 slices shown]
[im 1/25]
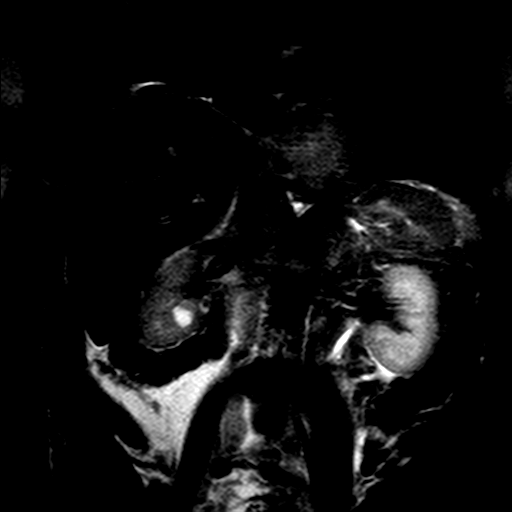

[Series 12: MRCP · coronal · 40.0mm · 0.91mm/px · 1 of 6 slices shown (1 of 2)]
[im 1/6]
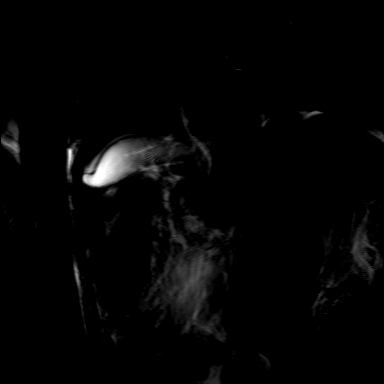

[Series 23: MRCP · axial · 0.79mm/px · 1 of 1 slices shown (2 of 2)]
[im 1/1]
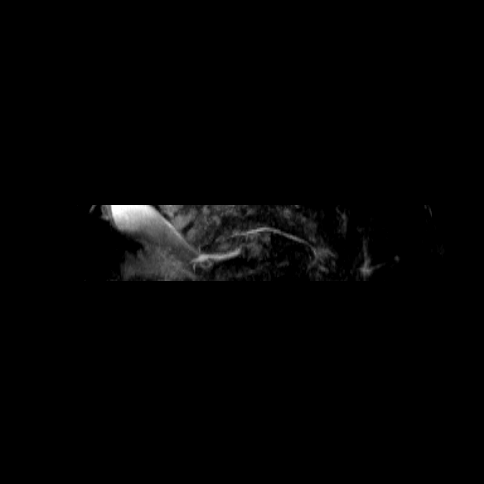

[17 of 48 positions shown; findings below may reference images not displayed]

FINDINGS: Lower chest: Small bilateral pleural effusions and bibasilar
atelectasis. There is a moderate to large hiatal hernia noted. No
pericardial effusion.

Hepatobiliary: Small left hepatic lobe hemangioma is noted. No
worrisome hepatic lesions or intrahepatic biliary dilatation. The
gallbladder is normal. No gallstones are identified. Small amount of
pericholecystic fluid likely related to the pancreatitis. Normal
caliber and course of the common bile duct. No common bile duct
stones.

Pancreas: Severe changes of pancreatitis involving and head region
of the pancreas. The head is swollen and inflamed no significant
peripancreatic inflammation/ inflammatory phlegmon with marked
inflammation of the second and third portions of the duodenum. Small
amount of free fluid is also noted in the upper abdomen and right
lower quadrant. No findings for the pancreatic necrosis or abscess.

Normal caliber and course of the main pancreatic duct.

Spleen:  Normal size.  No focal lesions.

Adrenals/Urinary Tract:  The adrenal glands are normal.

Small right renal cysts are noted. There is also a small hemorrhagic
cyst associated with the midpole region of left kidney posteriorly.
No worrisome renal lesions.

Stomach/Bowel: Inflammation of the second and third portions of the
duodenum but no obstructive findings. The visualized small bowel and
colon are otherwise normal.

Vascular/Lymphatic: No pathologically enlarged lymph nodes
identified. No abdominal aortic aneurysm demonstrated.

Other:  No abdominal wall hernia or subcutaneous lesions.

Musculoskeletal: No significant bony findings. Incidental T10
hemangiomas noted.
IMPRESSION: 1. Acute pancreatitis with specific findings as discussed above. No
evidence of pancreatic necrosis. Normal caliber and course of the
common bile duct and pancreatic duct. No gallstones or common bile
duct stones.
2. Associated marked inflammation of the second and third portions
of the duodenum but no obstruction.
3. Small right renal cysts and small hemorrhagic cyst involving the
left kidney.
